# Patient Record
Sex: Male | Born: 1965
Health system: Southern US, Community
[De-identification: ages and names within clinical notes are randomized; demographics above are authoritative.]

## PROBLEM LIST (undated history)

## (undated) DIAGNOSIS — E079 Disorder of thyroid, unspecified: Secondary | ICD-10-CM

## (undated) DIAGNOSIS — M1990 Unspecified osteoarthritis, unspecified site: Secondary | ICD-10-CM

## (undated) DIAGNOSIS — I10 Essential (primary) hypertension: Secondary | ICD-10-CM

## (undated) DIAGNOSIS — E785 Hyperlipidemia, unspecified: Secondary | ICD-10-CM

## (undated) DIAGNOSIS — K219 Gastro-esophageal reflux disease without esophagitis: Secondary | ICD-10-CM

## (undated) HISTORY — PX: WISDOM TOOTH EXTRACTION: SHX21

## (undated) HISTORY — PX: UPPER GASTROINTESTINAL ENDOSCOPY: SHX188

## (undated) HISTORY — PX: APPENDECTOMY: SHX54

## (undated) HISTORY — PX: OTHER SURGICAL HISTORY: SHX169

## (undated) HISTORY — DX: Gastro-esophageal reflux disease without esophagitis: K21.9

## (undated) HISTORY — DX: Unspecified osteoarthritis, unspecified site: M19.90

## (undated) HISTORY — DX: Essential (primary) hypertension: I10

## (undated) HISTORY — DX: Hyperlipidemia, unspecified: E78.5

## (undated) HISTORY — DX: Disorder of thyroid, unspecified: E07.9

---

## 2006-01-17 ENCOUNTER — Ambulatory Visit: Payer: Self-pay | Admitting: Family Medicine

## 2006-04-19 ENCOUNTER — Ambulatory Visit: Payer: Self-pay | Admitting: Family Medicine

## 2006-05-09 ENCOUNTER — Ambulatory Visit: Payer: Self-pay | Admitting: Family Medicine

## 2009-02-17 ENCOUNTER — Ambulatory Visit: Payer: Self-pay | Admitting: Family Medicine

## 2010-01-02 HISTORY — PX: ESOPHAGOGASTRODUODENOSCOPY: SHX1529

## 2010-02-02 ENCOUNTER — Ambulatory Visit
Admission: RE | Admit: 2010-02-02 | Discharge: 2010-02-02 | Disposition: A | Discharge status: Home / Self Care | Payer: BC Managed Care – HMO | Source: Ambulatory Visit | Attending: Chiropractic Medicine | Admitting: Chiropractic Medicine

## 2010-02-02 ENCOUNTER — Other Ambulatory Visit: Payer: Self-pay | Admitting: Chiropractic Medicine

## 2010-02-02 DIAGNOSIS — M79642 Pain in left hand: Secondary | ICD-10-CM

## 2010-02-28 ENCOUNTER — Ambulatory Visit (INDEPENDENT_AMBULATORY_CARE_PROVIDER_SITE_OTHER): Payer: BC Managed Care – HMO | Admitting: Family Medicine

## 2010-02-28 DIAGNOSIS — J209 Acute bronchitis, unspecified: Secondary | ICD-10-CM

## 2010-02-28 DIAGNOSIS — K219 Gastro-esophageal reflux disease without esophagitis: Secondary | ICD-10-CM

## 2010-02-28 DIAGNOSIS — J0100 Acute maxillary sinusitis, unspecified: Secondary | ICD-10-CM

## 2010-02-28 DIAGNOSIS — R1310 Dysphagia, unspecified: Secondary | ICD-10-CM

## 2010-03-04 ENCOUNTER — Institutional Professional Consult (permissible substitution) (INDEPENDENT_AMBULATORY_CARE_PROVIDER_SITE_OTHER): Payer: BC Managed Care – PPO | Admitting: Family Medicine

## 2010-03-04 DIAGNOSIS — R030 Elevated blood-pressure reading, without diagnosis of hypertension: Secondary | ICD-10-CM

## 2010-03-04 DIAGNOSIS — R946 Abnormal results of thyroid function studies: Secondary | ICD-10-CM

## 2010-03-04 DIAGNOSIS — E739 Lactose intolerance, unspecified: Secondary | ICD-10-CM

## 2010-04-25 ENCOUNTER — Ambulatory Visit (INDEPENDENT_AMBULATORY_CARE_PROVIDER_SITE_OTHER): Payer: BC Managed Care – PPO | Admitting: Family Medicine

## 2010-04-25 DIAGNOSIS — F430 Acute stress reaction: Secondary | ICD-10-CM

## 2010-04-25 DIAGNOSIS — I10 Essential (primary) hypertension: Secondary | ICD-10-CM

## 2010-04-25 DIAGNOSIS — E039 Hypothyroidism, unspecified: Secondary | ICD-10-CM

## 2010-06-13 ENCOUNTER — Ambulatory Visit: Payer: BC Managed Care – PPO | Admitting: Family Medicine

## 2010-08-25 ENCOUNTER — Encounter: Payer: Self-pay | Admitting: Family Medicine

## 2010-08-25 ENCOUNTER — Other Ambulatory Visit: Payer: Self-pay | Admitting: Family Medicine

## 2010-08-25 ENCOUNTER — Ambulatory Visit (INDEPENDENT_AMBULATORY_CARE_PROVIDER_SITE_OTHER): Payer: BC Managed Care – PPO | Admitting: Family Medicine

## 2010-08-25 VITALS — BP 150/110 | HR 76 | Wt 240.0 lb

## 2010-08-25 DIAGNOSIS — K219 Gastro-esophageal reflux disease without esophagitis: Secondary | ICD-10-CM

## 2010-08-25 DIAGNOSIS — I10 Essential (primary) hypertension: Secondary | ICD-10-CM

## 2010-08-25 DIAGNOSIS — Z79899 Other long term (current) drug therapy: Secondary | ICD-10-CM

## 2010-08-25 DIAGNOSIS — Z23 Encounter for immunization: Secondary | ICD-10-CM

## 2010-08-25 DIAGNOSIS — E039 Hypothyroidism, unspecified: Secondary | ICD-10-CM

## 2010-08-25 MED ORDER — LISINOPRIL-HYDROCHLOROTHIAZIDE 10-12.5 MG PO TABS: 1.0000 | ORAL_TABLET | Freq: Every day | ORAL | Status: DC

## 2010-08-25 MED ORDER — LEVOTHYROXINE SODIUM 100 MCG PO TABS: 100.0000 ug | ORAL_TABLET | Freq: Every day | ORAL | Status: DC

## 2010-08-25 NOTE — Patient Instructions (Signed)
Take her thyroid medicine and blood pressure medicine regularly. Return here one month for blood pressure recheck. Strongly consider getting counseling concerning your marriage.

## 2010-08-25 NOTE — Progress Notes (Signed)
  Subjective:    Patient ID: Justin Green, male    DOB: 04-06-1965, 45 y.o.   MRN: [id number]  HPI He is here for recheck. He ran out of his blood pressure medications however he has continued on his thyroid medicine as well as reflux medication. The reflux medicine seems to be doing well. He also noted that when he got back on his thyroid medicine, he did have an energy increase. He and his wife are now separated. He seems to be handling this well but is somewhat resigned to it. They did not get counseling. They are still in contact with each other.   Review of Systems     Objective:   Physical Exam Alert and in no distress. Blood pressure is recorded.       Assessment & Plan:  Hypertension. Hypothyroid. GERD. Situational stress I will give him a flu shot. His blood pressure medicine was renewed. TSH will be drawn. Continue on his current medication. I again stressed the need for him to get involved in counseling just to make sure he learns all the right things from his marriage. Approximately 25 minutes spent discussing these issues with him.

## 2010-08-26 ENCOUNTER — Telehealth: Payer: Self-pay

## 2010-08-26 NOTE — Telephone Encounter (Signed)
Called pt to inform of labs left message ths normal continue on present meds

## 2011-01-17 ENCOUNTER — Ambulatory Visit (INDEPENDENT_AMBULATORY_CARE_PROVIDER_SITE_OTHER): Payer: BC Managed Care – PPO | Admitting: Family Medicine

## 2011-01-17 ENCOUNTER — Encounter: Payer: Self-pay | Admitting: Family Medicine

## 2011-01-17 VITALS — BP 140/100 | HR 72 | Wt 250.0 lb

## 2011-01-17 DIAGNOSIS — R5383 Other fatigue: Secondary | ICD-10-CM

## 2011-01-17 DIAGNOSIS — I10 Essential (primary) hypertension: Secondary | ICD-10-CM

## 2011-01-17 DIAGNOSIS — R5381 Other malaise: Secondary | ICD-10-CM

## 2011-01-17 DIAGNOSIS — K219 Gastro-esophageal reflux disease without esophagitis: Secondary | ICD-10-CM | POA: Insufficient documentation

## 2011-01-17 MED ORDER — LISINOPRIL-HYDROCHLOROTHIAZIDE 20-12.5 MG PO TABS: 1.0000 | ORAL_TABLET | Freq: Every day | ORAL | Status: DC

## 2011-01-17 NOTE — Progress Notes (Signed)
  Subjective:    Patient ID: Justin Green, male    DOB: 01/31/1965, 46 y.o.   MRN: [id number]  HPI He is here for consult. He did injure his left lateral rib area over the holidays. He was evaluated and given tramadol. The pain is slowly going away. He also recently injured his left shoulder when he had it forcibly pushed posteriorly. He is now having some posterior shoulder discomfort. He continues on his blood pressure medication. She is concerned over possibility of sleep apnea and does note snoring as well as his ex-wife saying he does stop breathing. He has also had difficulty with falling asleep easily while sitting in a chair but no trouble with driving. He does have reflux disease and presently is on Dexilant and getting good results.  Review of Systems     Objective:   Physical Exam No tenderness to palpation of his ribs. Lungs are clear to auscultation. Shoulder exam shows limitation of external rotation or abduction is fairly good. No laxity noted. No tenderness to palpation.      Assessment & Plan:   1. Fatigue   2. Hypertension   3. GERD (gastroesophageal reflux disease)   4 rib pain. 5 shoulder strain. Continue with conservative care for the rib and the shoulder. I will order a sleep study. Increase his lisinopril and recheck this in one or 2 months. Continue on his Exelon.

## 2011-02-01 ENCOUNTER — Telehealth: Payer: Self-pay

## 2011-02-01 NOTE — Telephone Encounter (Signed)
LEFT MESSAGE FOR PT TO CALL ME BACK PT NEEDS OFFICEL SLEEP STUDY DOES HE WANT ONE AT HOME OR SOMEWHERE OUT

## 2011-02-22 ENCOUNTER — Other Ambulatory Visit: Payer: Self-pay

## 2011-02-22 ENCOUNTER — Telehealth: Payer: Self-pay

## 2011-02-22 DIAGNOSIS — G4730 Sleep apnea, unspecified: Secondary | ICD-10-CM

## 2011-02-22 NOTE — Telephone Encounter (Signed)
Called 3rd time ans left message pt needs offical sleep study can be done at home or out just to let me know I will send everything in and they will contact him

## 2011-03-09 ENCOUNTER — Other Ambulatory Visit: Payer: Self-pay | Admitting: Family Medicine

## 2011-03-15 ENCOUNTER — Ambulatory Visit (HOSPITAL_BASED_OUTPATIENT_CLINIC_OR_DEPARTMENT_OTHER): Payer: BC Managed Care – PPO | Attending: Family Medicine | Admitting: Radiology

## 2011-03-15 VITALS — Ht 70.0 in | Wt 240.0 lb

## 2011-03-15 DIAGNOSIS — G4733 Obstructive sleep apnea (adult) (pediatric): Secondary | ICD-10-CM | POA: Insufficient documentation

## 2011-03-15 DIAGNOSIS — G4730 Sleep apnea, unspecified: Secondary | ICD-10-CM

## 2011-03-20 ENCOUNTER — Ambulatory Visit: Payer: BC Managed Care – PPO | Admitting: Family Medicine

## 2011-03-25 DIAGNOSIS — G4733 Obstructive sleep apnea (adult) (pediatric): Secondary | ICD-10-CM

## 2011-03-25 NOTE — Procedures (Signed)
NAME:  Justin Green, Justin Green NO.:  [id number]  MEDICAL RECORD NO.:  [id number]          PATIENT TYPE:  OUT  LOCATION:  SLEEP CENTER                 FACILITY:  [facility]  PHYSICIAN:  Barbaraann Share, MD,FCCPDATE OF BIRTH:  Sep 10, 1965  DATE OF STUDY:  03/15/2011                           NOCTURNAL POLYSOMNOGRAM  REFERRING PHYSICIAN:  Sharlot Gowda, M.D.  INDICATION FOR STUDY:  Hypersomnia with sleep apnea.  EPWORTH SLEEPINESS SCORE:  5.  MEDICATIONS:  SLEEP ARCHITECTURE:  The patient had a total sleep time of 344 minutes with no slow-wave sleep and only 48 minutes of REM.  Sleep onset latency was normal at 11 minutes and REM onset was normal at 89 minutes.  Sleep efficiency was moderately reduced at 72%.  RESPIRATORY DATA:  The patient was found to have 3 apneas and 74 obstructive hypopneas, giving him an apnea-hypopnea index of 13 events per hour.  The events occurred in all body positions and there was loud snoring noted throughout.  OXYGEN DATA:  There was O2 desaturation as low as 84% with the patient's obstructive events.  CARDIAC DATA:  No clinically significant arrhythmias were noted.  MOVEMENT-PARASOMNIA:  The patient had no significant leg jerks or other abnormal behavior seen.  IMPRESSIONS-RECOMMENDATIONS:  Mild obstructive sleep apnea/hypopnea syndrome with an AHI of 13 events per hour, and O2 desaturation as low as 84%.  Treatment for this degree of sleep apnea can include a trial of weight loss alone, upper airway surgery, dental appliance, and also CPAP.  It should be noted the patient did not meet split night criteria secondary to the majority of his events occurring after 1 a.m.     Barbaraann Share, MD,FCCP Diplomate, American Board of Sleep Medicine    [facility]MED[facility]  D:  03/25/2011 18:47:03  T:  03/25/2011 19:12:24  Job:  [id number]

## 2011-03-27 ENCOUNTER — Ambulatory Visit: Payer: BC Managed Care – PPO | Admitting: Family Medicine

## 2011-04-03 ENCOUNTER — Encounter: Payer: Self-pay | Admitting: Family Medicine

## 2011-04-03 ENCOUNTER — Ambulatory Visit (INDEPENDENT_AMBULATORY_CARE_PROVIDER_SITE_OTHER): Payer: BC Managed Care – PPO | Admitting: Family Medicine

## 2011-04-03 VITALS — BP 120/80 | HR 83 | Wt 250.0 lb

## 2011-04-03 DIAGNOSIS — M25519 Pain in unspecified shoulder: Secondary | ICD-10-CM

## 2011-04-03 DIAGNOSIS — I10 Essential (primary) hypertension: Secondary | ICD-10-CM

## 2011-04-03 DIAGNOSIS — M25512 Pain in left shoulder: Secondary | ICD-10-CM

## 2011-04-03 DIAGNOSIS — G4730 Sleep apnea, unspecified: Secondary | ICD-10-CM

## 2011-04-03 NOTE — Patient Instructions (Signed)
Let me know how you're shoulder feels. Return here in 2 months for recheck on your weight and to further discuss sleep apnea

## 2011-04-03 NOTE — Progress Notes (Signed)
  Subjective:    Patient ID: Justin Green, male    DOB: [birth year], 46 y.o.   MRN: [id number]  HPI He is here for followup visit. He was recently diagnosed with sleep apnea. That report was reviewed with him. Strongly encouraged weight reduction among other issues. He continues on his blood pressure medication and is having no difficulty with this. He continues to have difficulty with left shoulder pain. He describes an injury several months ago of internal rotation of the left shoulder adducted and then direct, to the posterior aspect of the shoulder. Since then he has had difficulty with abduction and external rotation and has noted decreased range of motion. No popping or locking.   Review of Systems     Objective:   Physical Exam Alert and in no distress. No point tenderness to palpation. Abduction and external rotation cause pain and was limited compared to the other side. Doppler test was uncomfortable. Supraspinatus testing was painful.       Assessment & Plan:   1. Hypertension   2. Sleep apnea   3. Left shoulder pain    continue blood pressure medication. Discussed sleep apnea with him in detail. He will make an effort to change his diet and exercise. Recheck this in 2 months. Discussed options concerning the shoulder. Decided to inject this with Xylocaine and epinephrine. 40 mg of Kenalog and 3 cc of Xylocaine was injected into the subacromial bursa without difficulty. He tolerated the procedure well. Hopefully this will improve his symptoms and if not further physical therapy and/or MRI will be done based on his response.

## 2011-05-22 ENCOUNTER — Encounter: Payer: Self-pay | Admitting: Family Medicine

## 2011-05-22 ENCOUNTER — Ambulatory Visit (INDEPENDENT_AMBULATORY_CARE_PROVIDER_SITE_OTHER): Payer: BC Managed Care – PPO | Admitting: Family Medicine

## 2011-05-22 VITALS — BP 122/82 | HR 70 | Wt 244.0 lb

## 2011-05-22 DIAGNOSIS — M25512 Pain in left shoulder: Secondary | ICD-10-CM

## 2011-05-22 DIAGNOSIS — K219 Gastro-esophageal reflux disease without esophagitis: Secondary | ICD-10-CM

## 2011-05-22 DIAGNOSIS — M25519 Pain in unspecified shoulder: Secondary | ICD-10-CM

## 2011-05-22 DIAGNOSIS — I10 Essential (primary) hypertension: Secondary | ICD-10-CM

## 2011-05-22 DIAGNOSIS — Z23 Encounter for immunization: Secondary | ICD-10-CM

## 2011-05-22 NOTE — Patient Instructions (Signed)
Use the Prilosec as needed. Stretch it out as much as you can

## 2011-05-22 NOTE — Progress Notes (Signed)
  Subjective:    Patient ID: Justin Green, male    DOB: January 08, 1965, 46 y.o.   MRN: [id number]  HPI He is here for blood pressure recheck. He continues on medications listed in the chart and is having no difficulty with it. His reflux is under good control. The left shoulder is doing better but he still occasionally will have a slight twinge. He was able to play golf last weekend it had no difficulty with that.   Review of Systems     Objective:   Physical Exam Alert and in no distress otherwise not examined       Assessment & Plan:   1. Hypertension   2. GERD (gastroesophageal reflux disease)   3. Left shoulder pain    continue present medication regimen. Discussed sending him for shoulder rehabilitation however his scheduled be quite busy. He will check with the physical therapist with the [facility] when he goes out there in the next several weeks.

## 2011-08-21 ENCOUNTER — Ambulatory Visit (INDEPENDENT_AMBULATORY_CARE_PROVIDER_SITE_OTHER): Payer: BC Managed Care – PPO | Admitting: Family Medicine

## 2011-08-21 ENCOUNTER — Encounter: Payer: Self-pay | Admitting: Family Medicine

## 2011-08-21 VITALS — BP 150/100 | HR 81 | Wt 255.0 lb

## 2011-08-21 DIAGNOSIS — M7700 Medial epicondylitis, unspecified elbow: Secondary | ICD-10-CM

## 2011-08-21 DIAGNOSIS — M7701 Medial epicondylitis, right elbow: Secondary | ICD-10-CM

## 2011-08-21 NOTE — Progress Notes (Signed)
  Subjective:    Patient ID: Justin Green, male    DOB: August 24, 1965, 46 y.o.   MRN: [id number]  HPI He has been having right medial elbow pain intermittently for the last year or 2. This bothers him especially when he plays golf. 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   Review of Systems     Objective:   Physical Exam Full motion of the elbow. Tender to palpation over the medial epicondyle. No tenderness over the ulnar notch.       Assessment & Plan:   1. Medial epicondylitis of right elbow    recommend heat, anti-inflammatory, elbow padding. Discussed physical therapy and possibly injecting this but not at this time.

## 2011-08-21 NOTE — Patient Instructions (Signed)
Heat 20 minutes 3 times a day. Use the elbow pad keep it about an inch below the medial epicondyle. Potentially consider getting larger grips for your golf clubs.

## 2011-11-05 IMAGING — CR DG HAND COMPLETE 3+V*L*
4 series · 4 of 4 positions shown · non-contrast
Comparison: None.

CLINICAL DATA: Middle finger injury with pain and PIP joint
distally.

LEFT HAND - COMPLETE 3+ VIEW

[x hand pa left]
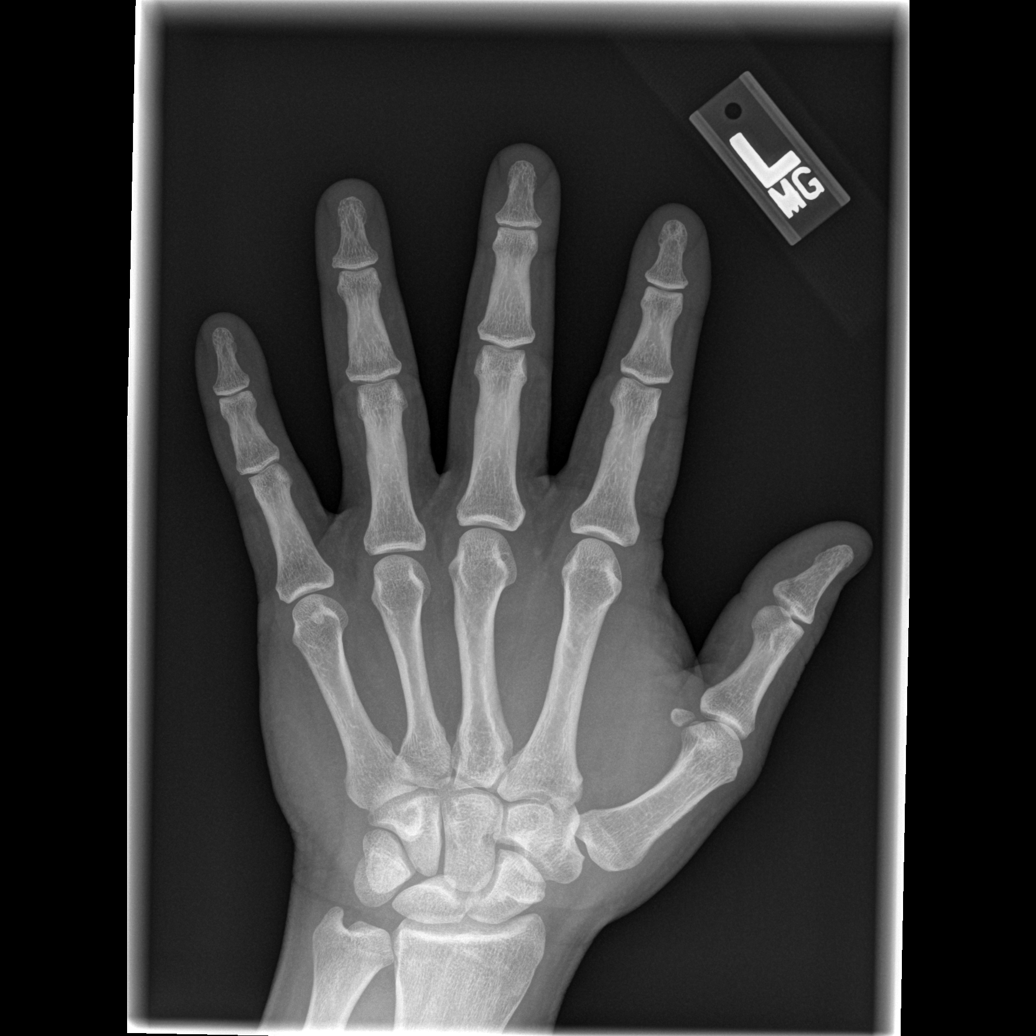

[x hand oblique left]
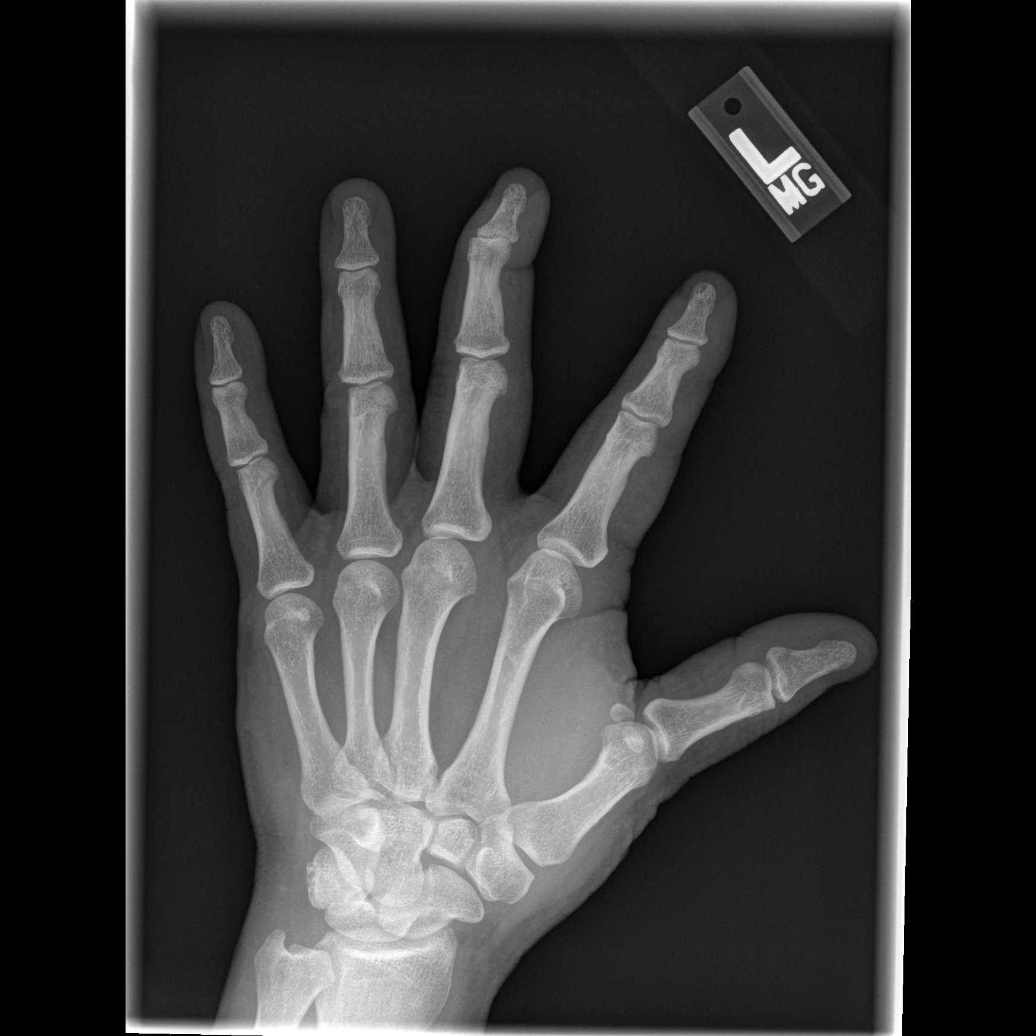

[x hand lat left (1 of 2)]
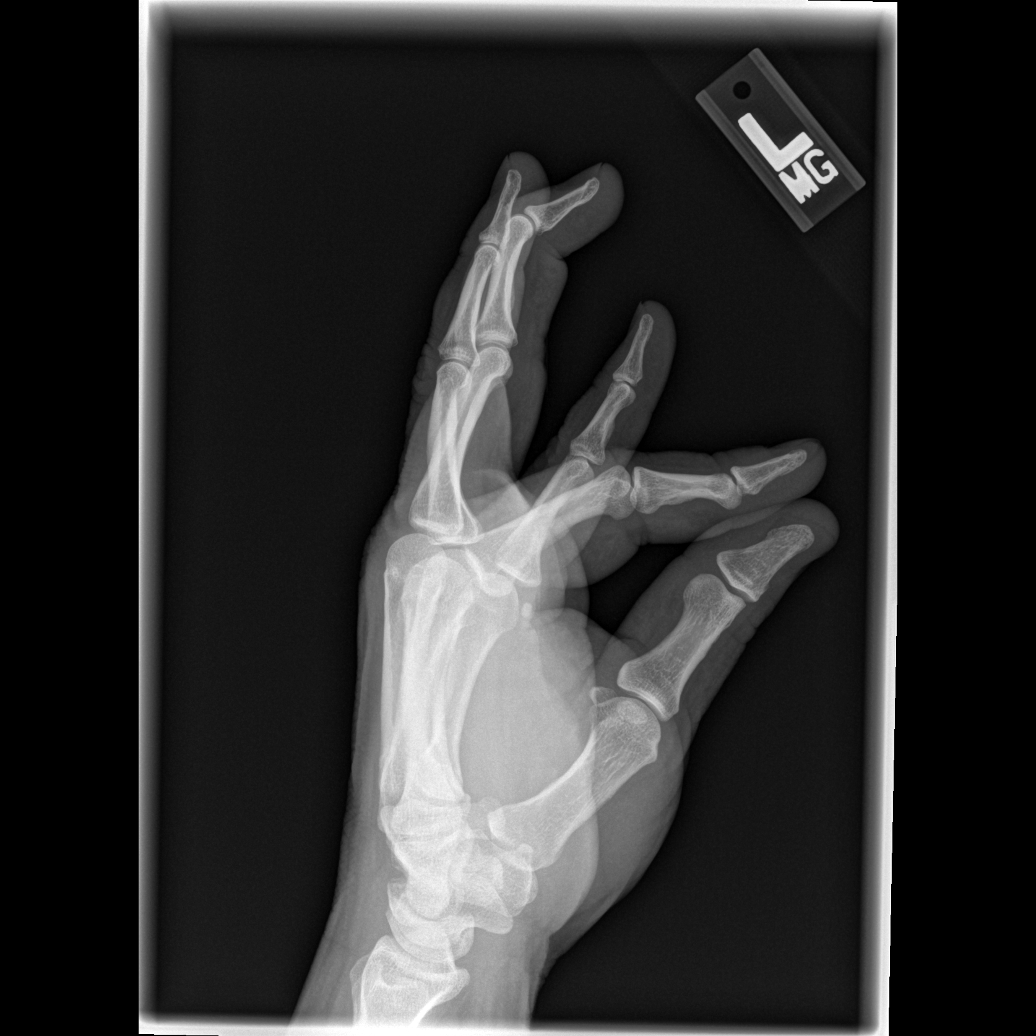

[x hand lat left (2 of 2)]
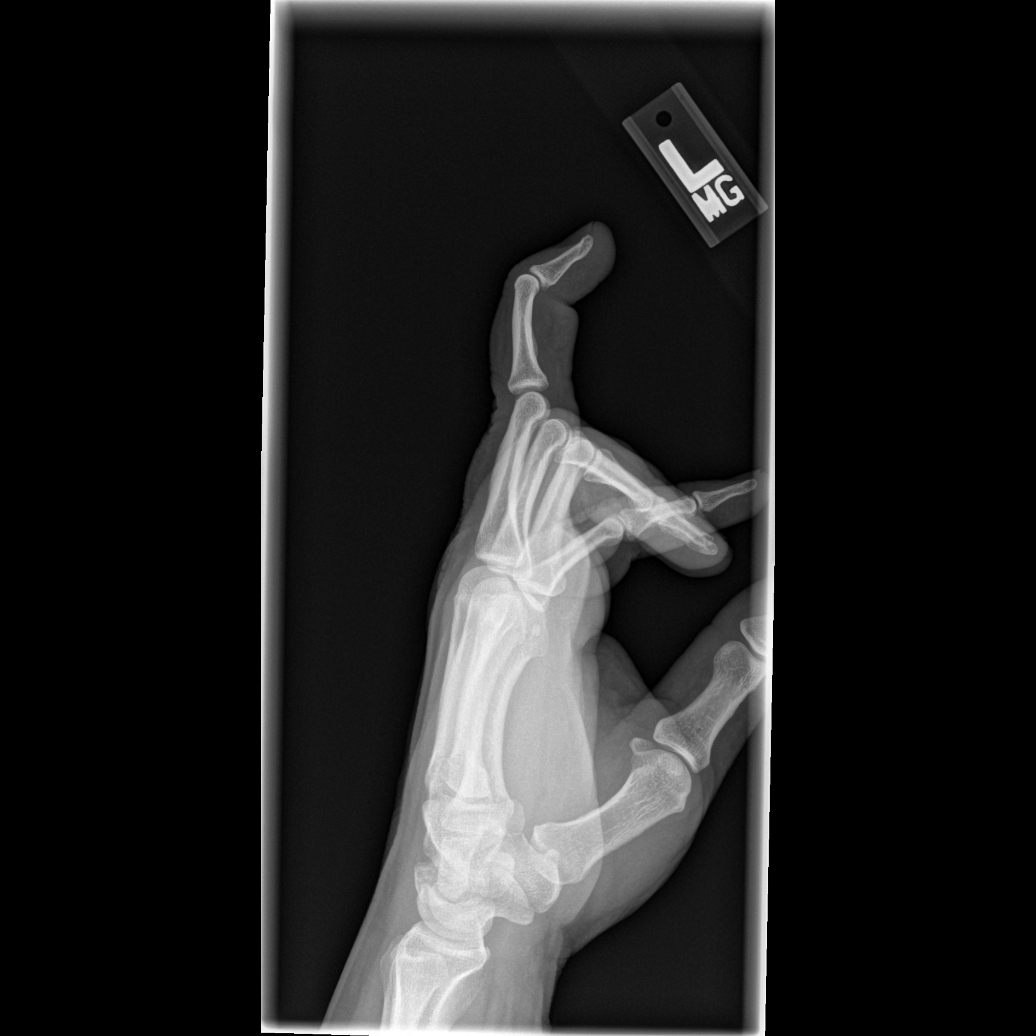

[4 of 4 positions shown; findings below may reference images not displayed]

FINDINGS: Three-view exam shows no evidence for an acute fracture.
No subluxation or dislocation.  There is flexion deformity at the
DIP joint of middle finger without evidence for bony avulsion from
the base of the distal phalanx.
IMPRESSION: Flexion deformity at the DIP joint of the middle finger without
associated acute bony findings.

## 2012-01-15 ENCOUNTER — Other Ambulatory Visit: Payer: Self-pay | Admitting: Family Medicine

## 2012-03-21 ENCOUNTER — Other Ambulatory Visit: Payer: Self-pay | Admitting: Family Medicine

## 2012-04-19 ENCOUNTER — Other Ambulatory Visit: Payer: Self-pay | Admitting: Family Medicine

## 2012-06-11 ENCOUNTER — Ambulatory Visit (INDEPENDENT_AMBULATORY_CARE_PROVIDER_SITE_OTHER): Payer: BC Managed Care – PPO | Admitting: Family Medicine

## 2012-06-11 ENCOUNTER — Encounter: Payer: Self-pay | Admitting: Family Medicine

## 2012-06-11 VITALS — BP 130/88 | HR 68 | Ht 69.0 in | Wt 250.0 lb

## 2012-06-11 DIAGNOSIS — G4730 Sleep apnea, unspecified: Secondary | ICD-10-CM

## 2012-06-11 DIAGNOSIS — Z79899 Other long term (current) drug therapy: Secondary | ICD-10-CM

## 2012-06-11 DIAGNOSIS — Z0000 Encounter for general adult medical examination without abnormal findings: Secondary | ICD-10-CM

## 2012-06-11 DIAGNOSIS — E669 Obesity, unspecified: Secondary | ICD-10-CM

## 2012-06-11 DIAGNOSIS — K219 Gastro-esophageal reflux disease without esophagitis: Secondary | ICD-10-CM

## 2012-06-11 DIAGNOSIS — E039 Hypothyroidism, unspecified: Secondary | ICD-10-CM | POA: Insufficient documentation

## 2012-06-11 DIAGNOSIS — I10 Essential (primary) hypertension: Secondary | ICD-10-CM

## 2012-06-11 LAB — CBC WITH DIFFERENTIAL/PLATELET
Basophils Absolute: 0 10*3/uL (ref 0.0–0.1)
HCT: 42.5 % (ref 39.0–52.0)
Lymphocytes Relative: 22 % (ref 12–46)
Monocytes Absolute: 0.8 10*3/uL (ref 0.1–1.0)
Neutro Abs: 5.7 10*3/uL (ref 1.7–7.7)
RDW: 13.2 % (ref 11.5–15.5)
WBC: 8.6 10*3/uL (ref 4.0–10.5)

## 2012-06-11 LAB — POCT URINALYSIS DIPSTICK
Bilirubin, UA: NEGATIVE
Ketones, UA: NEGATIVE
Leukocytes, UA: NEGATIVE
Protein, UA: NEGATIVE

## 2012-06-11 LAB — COMPREHENSIVE METABOLIC PANEL
ALT: 22 U/L (ref 0–53)
AST: 15 U/L (ref 0–37)
Albumin: 4.4 g/dL (ref 3.5–5.2)
BUN: 22 mg/dL (ref 6–23)
Calcium: 9.3 mg/dL (ref 8.4–10.5)
Chloride: 100 mEq/L (ref 96–112)
Potassium: 4.2 mEq/L (ref 3.5–5.3)
Sodium: 138 mEq/L (ref 135–145)
Total Protein: 6.9 g/dL (ref 6.0–8.3)

## 2012-06-11 LAB — HEMOCCULT GUIAC POC 1CARD (OFFICE)

## 2012-06-11 LAB — TSH: TSH: 7.469 u[IU]/mL — ABNORMAL HIGH (ref 0.350–4.500)

## 2012-06-11 MED ORDER — LISINOPRIL-HYDROCHLOROTHIAZIDE 20-12.5 MG PO TABS: ORAL_TABLET | ORAL | Status: DC

## 2012-06-11 MED ORDER — LEVOTHYROXINE SODIUM 100 MCG PO TABS: ORAL_TABLET | ORAL | Status: DC

## 2012-06-11 NOTE — Progress Notes (Signed)
  Subjective:    Patient ID: Justin Green, male    DOB: 12-08-1965, 47 y.o.   MRN: [id number]  HPI He is here for complete examination. He does have underlying hypertension and continues on his lisinopril without difficulty. He also takes his reflux medication every other day and this controls his symptoms. He does have underlying sleep apnea but is not on CPAP. He continues on his thyroid medication. Social and family history were reviewed. Patient is dating someone but not using condoms. His immunizations were reviewed.   Review of Systems Negative except as above    Objective:   Physical Exam BP 130/88  Pulse 68  Ht 5\' 9"  (1.753 m)  Wt 250 lb (113.399 kg)  BMI 36.9 kg/m2  General Appearance:    Alert, cooperative, no distress, appears stated age  Head:    Normocephalic, without obvious abnormality, atraumatic  Eyes:    PERRL, conjunctiva/corneas clear, EOM's intact, fundi    benign  Ears:    Normal TM's and external ear canals  Nose:   Nares normal, mucosa normal, no drainage or sinus   tenderness  Throat:   Lips, mucosa, and tongue normal; teeth and gums normal  Neck:   Supple, no lymphadenopathy;  thyroid:  no   enlargement/tenderness/nodules; no carotid   bruit or JVD  Back:    Spine nontender, no curvature, ROM normal, no CVA     tenderness  Lungs:     Clear to auscultation bilaterally without wheezes, rales or     ronchi; respirations unlabored  Chest Wall:    No tenderness or deformity   Heart:    Regular rate and rhythm, S1 and S2 normal, no murmur, rub   or gallop  Breast Exam:    No chest wall tenderness, masses or gynecomastia  Abdomen:     Soft, non-tender, nondistended, normoactive bowel sounds,    no masses, no hepatosplenomegaly  Genitalia:    Normal male external genitalia without lesions.  Testicles without masses.  No inguinal hernias.  Rectal:    Normal sphincter tone, no masses or tenderness; guaiac negative stool.  Prostate smooth, no nodules, not  enlarged.  Extremities:   No clubbing, cyanosis or edema  Pulses:   2+ and symmetric all extremities  Skin:   Skin color, texture, turgor normal, no rashes or lesions  Lymph nodes:   Cervical, supraclavicular, and axillary nodes normal  Neurologic:   CNII-XII intact, normal strength, sensation and gait; reflexes 2+ and symmetric throughout          Psych:   Normal mood, affect, hygiene and grooming.          Assessment & Plan:  Routine general medical examination at a health care facility - Plan: CBC with Differential, Comprehensive metabolic panel, Lipid panel, Hemoccult - 1 Card (office)  Hypertension - Plan: POCT urinalysis dipstick, lisinopril-hydrochlorothiazide (PRINZIDE,ZESTORETIC) 20-12.5 MG per tablet  GERD (gastroesophageal reflux disease)  Sleep apnea  Hypothyroid - Plan: levothyroxine (SYNTHROID, LEVOTHROID) 100 MCG tablet, TSH  Encounter for long-term (current) use of other medications - Plan: CBC with Differential, Comprehensive metabolic panel, Lipid panel, TSH  Obesity (BMI 30-39.9) his diet and exercise pattern due to work are going to be difficult for him to change. Strongly encouraged him to use condoms with his new girlfriend. continue on his present medication regimen.

## 2012-06-12 NOTE — Progress Notes (Signed)
Quick Note:  CALLED PT CELL # LEFT WORD FOR WORD MESSAGE The thyroid function test is indicating low thyroid. Reinforced the need for him to take it in the morning on an empty stomach and let's recheck this in several months ______

## 2012-08-20 ENCOUNTER — Other Ambulatory Visit: Payer: Self-pay | Admitting: Family Medicine

## 2012-12-25 ENCOUNTER — Encounter: Payer: Self-pay | Admitting: Family Medicine

## 2013-06-30 ENCOUNTER — Other Ambulatory Visit: Payer: Self-pay | Admitting: Family Medicine

## 2013-08-30 ENCOUNTER — Other Ambulatory Visit: Payer: Self-pay | Admitting: Family Medicine

## 2013-09-01 ENCOUNTER — Other Ambulatory Visit: Payer: Self-pay | Admitting: Family Medicine

## 2013-09-24 ENCOUNTER — Other Ambulatory Visit: Payer: Self-pay

## 2013-10-17 ENCOUNTER — Other Ambulatory Visit: Payer: Self-pay

## 2014-08-07 ENCOUNTER — Other Ambulatory Visit: Payer: Self-pay | Admitting: Family Medicine

## 2014-08-10 ENCOUNTER — Other Ambulatory Visit: Payer: Self-pay | Admitting: Family Medicine

## 2014-08-10 ENCOUNTER — Telehealth: Payer: Self-pay | Admitting: Family Medicine

## 2014-08-10 MED ORDER — LEVOTHYROXINE SODIUM 100 MCG PO TABS: 100.0000 ug | ORAL_TABLET | Freq: Every day | ORAL | Status: DC

## 2014-08-10 NOTE — Telephone Encounter (Signed)
He needs a visit. Don't let him run out

## 2014-08-10 NOTE — Telephone Encounter (Signed)
Dr.Lalonde pt has not been here since 06/2012

## 2014-08-10 NOTE — Telephone Encounter (Signed)
done

## 2014-08-10 NOTE — Telephone Encounter (Signed)
Rcvd refill request for Levothyroxine 166mcg

## 2014-08-10 NOTE — Telephone Encounter (Signed)
He needs an office visit. Give him enough to cover until he comes in

## 2014-08-28 ENCOUNTER — Encounter: Payer: Self-pay | Admitting: Family Medicine

## 2014-08-28 ENCOUNTER — Ambulatory Visit (INDEPENDENT_AMBULATORY_CARE_PROVIDER_SITE_OTHER): Payer: BLUE CROSS/BLUE SHIELD | Admitting: Family Medicine

## 2014-08-28 ENCOUNTER — Other Ambulatory Visit: Payer: Self-pay | Admitting: Family Medicine

## 2014-08-28 VITALS — BP 120/90 | HR 76 | Ht 69.0 in | Wt 262.8 lb

## 2014-08-28 DIAGNOSIS — E038 Other specified hypothyroidism: Secondary | ICD-10-CM

## 2014-08-28 DIAGNOSIS — I10 Essential (primary) hypertension: Secondary | ICD-10-CM

## 2014-08-28 DIAGNOSIS — K219 Gastro-esophageal reflux disease without esophagitis: Secondary | ICD-10-CM

## 2014-08-28 DIAGNOSIS — Z0000 Encounter for general adult medical examination without abnormal findings: Secondary | ICD-10-CM | POA: Diagnosis not present

## 2014-08-28 DIAGNOSIS — Z23 Encounter for immunization: Secondary | ICD-10-CM | POA: Diagnosis not present

## 2014-08-28 DIAGNOSIS — G4730 Sleep apnea, unspecified: Secondary | ICD-10-CM | POA: Diagnosis not present

## 2014-08-28 DIAGNOSIS — R7303 Prediabetes: Secondary | ICD-10-CM

## 2014-08-28 LAB — CBC WITH DIFFERENTIAL/PLATELET
BASOS PCT: 1 % (ref 0–1)
Basophils Absolute: 0.1 10*3/uL (ref 0.0–0.1)
EOS PCT: 2 % (ref 0–5)
Eosinophils Absolute: 0.2 10*3/uL (ref 0.0–0.7)
HEMATOCRIT: 44.4 % (ref 39.0–52.0)
Hemoglobin: 14.9 g/dL (ref 13.0–17.0)
Lymphocytes Relative: 23 % (ref 12–46)
Lymphs Abs: 1.9 10*3/uL (ref 0.7–4.0)
MCH: 29.3 pg (ref 26.0–34.0)
MCHC: 33.6 g/dL (ref 30.0–36.0)
MCV: 87.2 fL (ref 78.0–100.0)
MONO ABS: 0.8 10*3/uL (ref 0.1–1.0)
MONOS PCT: 9 % (ref 3–12)
MPV: 11.3 fL (ref 8.6–12.4)
NEUTROS ABS: 5.5 10*3/uL (ref 1.7–7.7)
Neutrophils Relative %: 65 % (ref 43–77)
Platelets: 210 10*3/uL (ref 150–400)
RBC: 5.09 MIL/uL (ref 4.22–5.81)
RDW: 13.4 % (ref 11.5–15.5)
WBC: 8.4 10*3/uL (ref 4.0–10.5)

## 2014-08-28 LAB — POCT URINALYSIS DIPSTICK
Bilirubin, UA: NEGATIVE
Blood, UA: NEGATIVE
Glucose, UA: NEGATIVE
Ketones, UA: NEGATIVE
Leukocytes, UA: NEGATIVE
NITRITE UA: NEGATIVE
PH UA: 5.5
PROTEIN UA: NEGATIVE
Spec Grav, UA: >=1.03
UROBILINOGEN UA: NEGATIVE

## 2014-08-28 LAB — LIPID PANEL
CHOLESTEROL: 171 mg/dL (ref 125–200)
HDL: 37 mg/dL — ABNORMAL LOW (ref >=40)
LDL CALC: 110 mg/dL (ref <130)
Total CHOL/HDL Ratio: 4.6 Ratio (ref <=5.0)
Triglycerides: 121 mg/dL (ref <150)
VLDL: 24 mg/dL (ref <30)

## 2014-08-28 LAB — COMPREHENSIVE METABOLIC PANEL
ALK PHOS: 65 U/L (ref 40–115)
ALT: 25 U/L (ref 9–46)
AST: 19 U/L (ref 10–40)
Albumin: 4.1 g/dL (ref 3.6–5.1)
BUN: 23 mg/dL (ref 7–25)
CO2: 25 mmol/L (ref 20–31)
CREATININE: 1.22 mg/dL (ref 0.60–1.35)
Calcium: 9.2 mg/dL (ref 8.6–10.3)
Chloride: 102 mmol/L (ref 98–110)
Glucose, Bld: 117 mg/dL — ABNORMAL HIGH (ref 65–99)
POTASSIUM: 4.3 mmol/L (ref 3.5–5.3)
SODIUM: 138 mmol/L (ref 135–146)
Total Bilirubin: 0.4 mg/dL (ref 0.2–1.2)
Total Protein: 7 g/dL (ref 6.1–8.1)

## 2014-08-28 LAB — TSH: TSH: 4.643 u[IU]/mL — ABNORMAL HIGH (ref 0.350–4.500)

## 2014-08-28 MED ORDER — LISINOPRIL-HYDROCHLOROTHIAZIDE 20-12.5 MG PO TABS: 1.0000 | ORAL_TABLET | Freq: Every day | ORAL | Status: DC

## 2014-08-28 MED ORDER — LEVOTHYROXINE SODIUM 100 MCG PO TABS: 100.0000 ug | ORAL_TABLET | Freq: Every day | ORAL | Status: DC

## 2014-08-28 NOTE — Progress Notes (Signed)
   Subjective:    Patient ID: Justin Green, male    DOB: [birth year], 49 y.o.   MRN: [id number]  HPI He is here for complete examination. He does live in [place] but prefers to come here for his care. He does have skin tags in his armpits A1c is removed. He will set up an appointment for this at a later time. He is taking his reflux medicine every other day with good results. He does have OSA and presently is using a mouth appliance which apparently is helping him snore less. This is according to his girlfriend. He continues on his blood pressure medication without difficulty. He also takes thyroid. He has had no chest pain, shortness of breath, GI issues.He has cut down tremendously on his alcohol consumption and does keep himself active. His work is going well. Family and social history as well as health maintenance and immunizations were reviewed.   Review of Systems  All other systems reviewed and are negative.      Objective:   Physical Exam BP 120/90 mmHg  Pulse 76  Ht 5\' 9"  (1.753 m)  Wt 262 lb 12.8 oz (119.205 kg)  BMI 38.79 kg/m2  SpO2 99%  General Appearance:    Alert, cooperative, no distress, appears stated age  Head:    Normocephalic, without obvious abnormality, atraumatic  Eyes:    PERRL, conjunctiva/corneas clear, EOM's intact, fundi    benign  Ears:    Normal TM's and external ear canals  Nose:   Nares normal, mucosa normal, no drainage or sinus   tenderness  Throat:   Lips, mucosa, and tongue normal; teeth and gums normal  Neck:   Supple, no lymphadenopathy;  thyroid:  no   enlargement/tenderness/nodules; no carotid   bruit or JVD  Back:    Spine nontender, no curvature, ROM normal, no CVA     tenderness  Lungs:     Clear to auscultation bilaterally without wheezes, rales or     ronchi; respirations unlabored  Chest Wall:    No tenderness or deformity   Heart:    Regular rate and rhythm, S1 and S2 normal, no murmur, rub   or gallop  Breast Exam:    No chest  wall tenderness, masses or gynecomastia  Abdomen:     Soft, non-tender, nondistended, normoactive bowel sounds,    no masses, no hepatosplenomegaly        Extremities:   No clubbing, cyanosis or edema  Pulses:   2+ and symmetric all extremities  Skin:   Skin color, texture, turgor normal, no rashes or lesions  Lymph nodes:   Cervical, supraclavicular, and axillary nodes normal  Neurologic:   CNII-XII intact, normal strength, sensation and gait; reflexes 2+ and symmetric throughout          Psych:   Normal mood, affect, hygiene and grooming.          Assessment & Plan:  Routine general medical examination at a health care facility - Plan: POCT Urinalysis Dipstick, Comprehensive metabolic panel, CBC with Differential/Platelet, Lipid panel, TSH  Essential hypertension - Plan: lisinopril-hydrochlorothiazide (PRINZIDE,ZESTORETIC) 20-12.5 MG per tablet  Gastroesophageal reflux disease without esophagitis  Other specified hypothyroidism - Plan: TSH, levothyroxine (SYNTHROID, LEVOTHROID) 100 MCG tablet  Sleep apnea  Need for prophylactic vaccination and inoculation against influenza - Plan: Flu Vaccine QUAD 36+ mos IM I again discussed diet and exercise with him to help with weight reduction. Continue on present medication regimen. Return here as needed.

## 2014-08-29 ENCOUNTER — Other Ambulatory Visit: Payer: Self-pay | Admitting: Family Medicine

## 2014-08-30 MED ORDER — LEVOTHYROXINE SODIUM 112 MCG PO TABS: 112.0000 ug | ORAL_TABLET | Freq: Every day | ORAL | Status: DC

## 2014-08-30 NOTE — Addendum Note (Signed)
Addended by: Denita Lung on: 08/30/2014 07:27 AM   Modules accepted: Orders

## 2014-08-31 LAB — HEMOGLOBIN A1C
Hgb A1c MFr Bld: 6 % — ABNORMAL HIGH (ref <5.7)
MEAN PLASMA GLUCOSE: 126 mg/dL — AB (ref <117)

## 2014-09-01 DIAGNOSIS — R7303 Prediabetes: Secondary | ICD-10-CM | POA: Insufficient documentation

## 2014-09-15 ENCOUNTER — Ambulatory Visit (INDEPENDENT_AMBULATORY_CARE_PROVIDER_SITE_OTHER): Payer: BLUE CROSS/BLUE SHIELD | Admitting: Family Medicine

## 2014-09-15 DIAGNOSIS — Q828 Other specified congenital malformations of skin: Secondary | ICD-10-CM | POA: Diagnosis not present

## 2014-09-15 DIAGNOSIS — I10 Essential (primary) hypertension: Secondary | ICD-10-CM | POA: Diagnosis not present

## 2014-09-15 MED ORDER — AMLODIPINE BESYLATE 5 MG PO TABS: 5.0000 mg | ORAL_TABLET | Freq: Every day | ORAL | Status: DC

## 2014-09-15 NOTE — Progress Notes (Signed)
   Subjective:    Patient ID: Justin Green, male    DOB: 07-18-1965, 49 y.o.   MRN: [id number]  HPI He is here for excision of multiple skin tags in the left axilla. He does not have the money right. They've been there for several years. He is also here to have his blood pressure recheck.  Review of Systems     Objective:   Physical Exam 7 skin tag is noted in the left axilla. Blood pressure is recorded.       Assessment & Plan:  Essential hypertension - Plan: amLODipine (NORVASC) 5 MG tablet  Accessory skin tags The skin tags were excised and the base hyfrecated without difficulty. I will add amlodipine to his regimen and keep him on the lisinopril. Also discussed the need for him to make dietary changes. Check here one month

## 2014-10-22 ENCOUNTER — Ambulatory Visit (INDEPENDENT_AMBULATORY_CARE_PROVIDER_SITE_OTHER): Payer: BLUE CROSS/BLUE SHIELD | Admitting: Family Medicine

## 2014-10-22 ENCOUNTER — Other Ambulatory Visit: Payer: Self-pay | Admitting: Family Medicine

## 2014-10-22 ENCOUNTER — Encounter: Payer: Self-pay | Admitting: Family Medicine

## 2014-10-22 VITALS — BP 130/90 | HR 70 | Ht 69.0 in | Wt 263.0 lb

## 2014-10-22 DIAGNOSIS — I10 Essential (primary) hypertension: Secondary | ICD-10-CM | POA: Diagnosis not present

## 2014-10-22 MED ORDER — AMLODIPINE BESYLATE 10 MG PO TABS: 10.0000 mg | ORAL_TABLET | Freq: Every day | ORAL | Status: DC

## 2014-10-22 NOTE — Progress Notes (Signed)
   Subjective:    Patient ID: Justin Green, male    DOB: Jul 30, 1965, 49 y.o.   MRN: [id number]  HPI He is here for recheck on his hypertension. He has had no difficulty with the amlodipine. Specifically no edema noted.   Review of Systems     Objective:   Physical Exam Alert and in no distress. Blood pressure is recorded.       Assessment & Plan:  Essential hypertension - Plan: DISCONTINUED: amLODipine (NORVASC) 10 MG tablet  I will increase his amlodipine. Also discussed the need for him to make lifestyle changes in regard to diet and exercise. Encourage placing emphasis on making exercise changes rather than diet at this point. Recheck here one month.

## 2014-11-24 ENCOUNTER — Ambulatory Visit (INDEPENDENT_AMBULATORY_CARE_PROVIDER_SITE_OTHER): Payer: BLUE CROSS/BLUE SHIELD | Admitting: Family Medicine

## 2014-11-24 ENCOUNTER — Encounter: Payer: Self-pay | Admitting: Family Medicine

## 2014-11-24 VITALS — BP 114/90 | HR 78 | Ht 69.0 in | Wt 266.0 lb

## 2014-11-24 DIAGNOSIS — I10 Essential (primary) hypertension: Secondary | ICD-10-CM | POA: Diagnosis not present

## 2014-11-24 DIAGNOSIS — B001 Herpesviral vesicular dermatitis: Secondary | ICD-10-CM | POA: Diagnosis not present

## 2014-11-24 MED ORDER — VALACYCLOVIR HCL 1 G PO TABS: ORAL_TABLET | ORAL | Status: DC

## 2014-11-24 MED ORDER — AZILSARTAN-CHLORTHALIDONE 40-25 MG PO TABS: 1.0000 | ORAL_TABLET | Freq: Every day | ORAL | Status: DC

## 2014-11-24 NOTE — Progress Notes (Signed)
   Subjective:    Patient ID: Justin Green, male    DOB: 26-May-1965, 49 y.o.   MRN: [id number]  HPI He is here for recheck. He has been taking his medication and has noted some slight swelling and weight gain. He also would like medication for his herpes. He in the past has used a topical medication.   Review of Systems     Objective:   Physical Exam Alert and in no distress. Blood pressure is recorded. Blistered lesion noted on the left lower lateral lip.       Assessment & Plan:  Essential hypertension - Plan: Azilsartan-Chlorthalidone 40-25 MG TABS  Herpes labialis - Plan: valACYclovir (VALTREX) 1000 MG tablet  I will switch him to [employer] and have him stop his other 2 medications. We'll also give him Valtrex. Recheck here in one month.

## 2014-11-25 ENCOUNTER — Telehealth: Payer: Self-pay | Admitting: Family Medicine

## 2014-11-25 NOTE — Telephone Encounter (Signed)
Called rep Audelia Acton & he gave list of 3 pharmacies in [place] that do the co pay program for [employer].  [facility] t# [phone] [place] t# [phone] on [address] t# [phone] on [address]

## 2014-12-29 ENCOUNTER — Encounter: Payer: Self-pay | Admitting: Family Medicine

## 2014-12-29 ENCOUNTER — Ambulatory Visit (INDEPENDENT_AMBULATORY_CARE_PROVIDER_SITE_OTHER): Payer: BLUE CROSS/BLUE SHIELD | Admitting: Family Medicine

## 2014-12-29 VITALS — BP 116/76 | HR 68 | Wt 266.6 lb

## 2014-12-29 DIAGNOSIS — I10 Essential (primary) hypertension: Secondary | ICD-10-CM

## 2014-12-29 MED ORDER — AZILSARTAN-CHLORTHALIDONE 40-25 MG PO TABS: 1.0000 | ORAL_TABLET | Freq: Every day | ORAL | Status: DC

## 2014-12-29 NOTE — Progress Notes (Signed)
   Subjective:    Patient ID: Justin Green, male    DOB: 29-Jan-1965, 49 y.o.   MRN: [id number]  HPI He is here for a recheck. He is now on Edarbychlor 40/25. He is having no problems with this.   Review of Systems     Objective:   Physical Exam Alert and in no distress. Blood pressure is recorded.       Assessment & Plan:  Essential hypertension  he now is under much better control. He will be getting his prescription from the pharmacy in [place] where she now lives. I did write him a prescription today.

## 2015-09-08 ENCOUNTER — Other Ambulatory Visit: Payer: Self-pay | Admitting: Family Medicine

## 2015-11-16 ENCOUNTER — Encounter: Payer: Self-pay | Admitting: Family Medicine

## 2015-11-16 ENCOUNTER — Ambulatory Visit (INDEPENDENT_AMBULATORY_CARE_PROVIDER_SITE_OTHER): Payer: BLUE CROSS/BLUE SHIELD | Admitting: Family Medicine

## 2015-11-16 VITALS — BP 118/80 | HR 78 | Ht 69.0 in | Wt 260.8 lb

## 2015-11-16 DIAGNOSIS — M7581 Other shoulder lesions, right shoulder: Secondary | ICD-10-CM | POA: Diagnosis not present

## 2015-11-16 DIAGNOSIS — E038 Other specified hypothyroidism: Secondary | ICD-10-CM

## 2015-11-16 DIAGNOSIS — R7303 Prediabetes: Secondary | ICD-10-CM | POA: Diagnosis not present

## 2015-11-16 DIAGNOSIS — B001 Herpesviral vesicular dermatitis: Secondary | ICD-10-CM

## 2015-11-16 DIAGNOSIS — Z23 Encounter for immunization: Secondary | ICD-10-CM | POA: Diagnosis not present

## 2015-11-16 DIAGNOSIS — I10 Essential (primary) hypertension: Secondary | ICD-10-CM | POA: Diagnosis not present

## 2015-11-16 LAB — LIPID PANEL
Cholesterol: 180 mg/dL (ref <200)
HDL: 38 mg/dL — ABNORMAL LOW (ref >40)
LDL CALC: 113 mg/dL — AB (ref <100)
TRIGLYCERIDES: 147 mg/dL (ref <150)
Total CHOL/HDL Ratio: 4.7 Ratio (ref <5.0)
VLDL: 29 mg/dL (ref <30)

## 2015-11-16 LAB — COMPREHENSIVE METABOLIC PANEL
ALBUMIN: 4.7 g/dL (ref 3.6–5.1)
ALT: 29 U/L (ref 9–46)
AST: 21 U/L (ref 10–35)
Alkaline Phosphatase: 64 U/L (ref 40–115)
BILIRUBIN TOTAL: 0.3 mg/dL (ref 0.2–1.2)
BUN: 31 mg/dL — ABNORMAL HIGH (ref 7–25)
CHLORIDE: 102 mmol/L (ref 98–110)
CO2: 27 mmol/L (ref 20–31)
CREATININE: 1.31 mg/dL (ref 0.70–1.33)
Calcium: 9.3 mg/dL (ref 8.6–10.3)
GLUCOSE: 91 mg/dL (ref 65–99)
Potassium: 4.2 mmol/L (ref 3.5–5.3)
SODIUM: 139 mmol/L (ref 135–146)
Total Protein: 7.4 g/dL (ref 6.1–8.1)

## 2015-11-16 LAB — CBC WITH DIFFERENTIAL/PLATELET
BASOS ABS: 0 {cells}/uL (ref 0–200)
Basophils Relative: 0 %
EOS PCT: 2 %
Eosinophils Absolute: 204 cells/uL (ref 15–500)
HCT: 44.2 % (ref 38.5–50.0)
Hemoglobin: 14.7 g/dL (ref 13.2–17.1)
LYMPHS ABS: 2142 {cells}/uL (ref 850–3900)
LYMPHS PCT: 21 %
MCH: 28.9 pg (ref 27.0–33.0)
MCHC: 33.3 g/dL (ref 32.0–36.0)
MCV: 86.8 fL (ref 80.0–100.0)
MONOS PCT: 8 %
MPV: 11.1 fL (ref 7.5–12.5)
Monocytes Absolute: 816 cells/uL (ref 200–950)
NEUTROS PCT: 69 %
Neutro Abs: 7038 cells/uL (ref 1500–7800)
PLATELETS: 243 10*3/uL (ref 140–400)
RBC: 5.09 MIL/uL (ref 4.20–5.80)
RDW: 13.3 % (ref 11.0–15.0)
WBC: 10.2 10*3/uL (ref 4.0–10.5)

## 2015-11-16 LAB — TSH: TSH: 11.6 mIU/L — ABNORMAL HIGH (ref 0.40–4.50)

## 2015-11-16 MED ORDER — AZILSARTAN-CHLORTHALIDONE 40-25 MG PO TABS: 1.0000 | ORAL_TABLET | Freq: Every day | ORAL | 11 refills | Status: DC

## 2015-11-16 MED ORDER — VALACYCLOVIR HCL 1 G PO TABS: ORAL_TABLET | ORAL | 2 refills | Status: DC

## 2015-11-16 MED ORDER — TRIAMCINOLONE ACETONIDE 40 MG/ML IJ SUSP
40.0000 mg | Freq: Once | INTRAMUSCULAR | Status: AC
  Administered 2015-11-16: 40 mg via INTRAMUSCULAR

## 2015-11-16 MED ORDER — LEVOTHYROXINE SODIUM 112 MCG PO TABS: ORAL_TABLET | ORAL | 3 refills | Status: DC

## 2015-11-16 MED ORDER — LIDOCAINE HCL 1 % IJ SOLN
3.0000 mL | Freq: Once | INTRAMUSCULAR | Status: AC
  Administered 2015-11-16: 3 mL via INTRADERMAL

## 2015-11-16 NOTE — Progress Notes (Signed)
   Subjective:    Patient ID: Justin Green, male    DOB: 05-29-1965, 50 y.o.   MRN: [id number]  HPI He is here to get his medications renewed. He continues on his blood pressure medication and is having no difficulty with this. He does occasionally use Valtrex to help with herpes labialis. He continues on his Synthroid having no difficulty with cold intolerance, skin changes. He also has a history of prediabetes. His weight has not changed much. His work schedule does interfere according hip was taking better care of himself. He also complains of a several month history of right shoulder pain especially with abduction and external rotation. He has had previous difficulty with shoulder as well as other joint discomfort but has never had any true injury. He is now having more pain and this is interfering with his ability to function. He also is noted pain even with playing golf. It occasionally will wake him up.   Review of Systems     Objective:   Physical Exam Full motion of the shoulder. Drop arm test was uncomfortable. Supraspinatus testing was negative. Neer's and Hawkins test was uncomfortable. Sulcus sign negative. Questionable anterior laxity noted. Hemoglobin A1c is 6.2      Assessment & Plan:  Rotator cuff tendinitis, right - Plan: triamcinolone acetonide (KENALOG-40) injection 40 mg, lidocaine (XYLOCAINE) 1 % (with pres) injection 3 mL  Need for prophylactic vaccination and inoculation against influenza - Plan: Flu Vaccine QUAD 36+ mos PF IM (Fluarix & Fluzone Quad PF)  Essential hypertension - Plan: Azilsartan-Chlorthalidone 40-25 MG TABS, CBC with Differential/Platelet, Comprehensive metabolic panel  Herpes labialis - Plan: valACYclovir (VALTREX) 1000 MG tablet  Other specified hypothyroidism - Plan: levothyroxine (SYNTHROID, LEVOTHROID) 112 MCG tablet, TSH  Prediabetes - Plan: Lipid panel, POCT glycosylated hemoglobin (Hb A1C) Scuffs treatment of the shoulder. We will  try the injection and see if this gives him relief and this so we'll forward with rehabilitation. He might need further evaluation including ultrasound to truly assess was going on in there. It is not necessarily clearcut rotator cuff tendinitis. He'll continue on his other medications. I again stressed the need for him to make diet and exercise changes. He is also return here in roughly 1 month for complete examination.

## 2015-11-17 ENCOUNTER — Encounter: Payer: Self-pay | Admitting: Family Medicine

## 2015-11-17 LAB — POCT GLYCOSYLATED HEMOGLOBIN (HGB A1C): Hemoglobin A1C: 6.2

## 2015-12-06 ENCOUNTER — Other Ambulatory Visit: Payer: Self-pay | Admitting: Family Medicine

## 2015-12-06 DIAGNOSIS — I10 Essential (primary) hypertension: Secondary | ICD-10-CM

## 2015-12-16 ENCOUNTER — Telehealth: Payer: Self-pay | Admitting: Family Medicine

## 2015-12-16 NOTE — Telephone Encounter (Signed)
Recv'd response no P.A. Needed covered medication, called pharmacy & went thru

## 2015-12-20 ENCOUNTER — Other Ambulatory Visit: Payer: Self-pay | Admitting: Family Medicine

## 2015-12-20 DIAGNOSIS — B001 Herpesviral vesicular dermatitis: Secondary | ICD-10-CM

## 2015-12-22 ENCOUNTER — Ambulatory Visit (INDEPENDENT_AMBULATORY_CARE_PROVIDER_SITE_OTHER): Payer: BLUE CROSS/BLUE SHIELD | Admitting: Family Medicine

## 2015-12-22 ENCOUNTER — Encounter: Payer: Self-pay | Admitting: Family Medicine

## 2015-12-22 VITALS — BP 120/78 | HR 68 | Temp 98.2°F | Ht 69.0 in | Wt 255.6 lb

## 2015-12-22 DIAGNOSIS — E038 Other specified hypothyroidism: Secondary | ICD-10-CM | POA: Diagnosis not present

## 2015-12-22 DIAGNOSIS — K219 Gastro-esophageal reflux disease without esophagitis: Secondary | ICD-10-CM | POA: Diagnosis not present

## 2015-12-22 DIAGNOSIS — I10 Essential (primary) hypertension: Secondary | ICD-10-CM

## 2015-12-22 DIAGNOSIS — G8929 Other chronic pain: Secondary | ICD-10-CM | POA: Diagnosis not present

## 2015-12-22 DIAGNOSIS — R7303 Prediabetes: Secondary | ICD-10-CM

## 2015-12-22 DIAGNOSIS — J069 Acute upper respiratory infection, unspecified: Secondary | ICD-10-CM | POA: Diagnosis not present

## 2015-12-22 DIAGNOSIS — B001 Herpesviral vesicular dermatitis: Secondary | ICD-10-CM

## 2015-12-22 DIAGNOSIS — Z0000 Encounter for general adult medical examination without abnormal findings: Secondary | ICD-10-CM | POA: Diagnosis not present

## 2015-12-22 DIAGNOSIS — M25511 Pain in right shoulder: Secondary | ICD-10-CM | POA: Diagnosis not present

## 2015-12-22 NOTE — Progress Notes (Addendum)
Subjective:    Patient ID: Justin Green, male    DOB: Mar 21, 1965, 50 y.o.   MRN: [id number]  HPI He is here for complete examination. He continues to have right shoulder pain. He notes a feeling of instability especially at night and has been awakened from the pain. Also certain maneuvers tend to make this pain much worse he sometimes feels as if it is coming out of joint... He did have an injection which helped only for short period time. He does have underlying reflux disease and is using medicine on an every other day basis. He has Valtrex for when he has a herpes outbreak on his lips. He does have underlying OSA but the parameters were such that he did not need to be on CPAP. He has had elevated blood sugars in the past with her A1c in the 6 range. He has lost several pounds within the last month or so. He is having difficulty with sore throat and sinus congestion. He is not having slightly productive cough over the last several days. He did have an elevated TSH however in the last month he has been very careful both take the medicine on an empty stomach. He continues on his blood pressure medication. He's had no difficulty with chest pain, shortness breath or urinary issues.   Review of Systems  All other systems reviewed and are negative.      Objective:   Physical Exam BP 120/78   Pulse 68   Temp 98.2 F (36.8 C) (Oral)   Ht 5\' 9"  (1.753 m)   Wt 255 lb 9.6 oz (115.9 kg)   BMI 37.75 kg/m   General Appearance:    Alert, cooperative, no distress, appears stated age  Head:    Normocephalic, without obvious abnormality, atraumatic  Eyes:    PERRL, conjunctiva/corneas clear, EOM's intact, fundi    benign  Ears:    Normal TM's and external ear canals  Nose:   Nares normal, mucosa normal, no drainage or sinus   tenderness  Throat:   Lips, mucosa, and tongue normal; teeth and gums normal  Neck:   Supple, no lymphadenopathy;  thyroid:  no   enlargement/tenderness/nodules; no  carotid   bruit or JVD  Back:    Spine nontender, no curvature, ROM normal, no CVA     tenderness  Lungs:     Clear to auscultation bilaterally without wheezes, rales or     ronchi; respirations unlabored      Heart:    Regular rate and rhythm, S1 and S2 normal, no murmur, rub   or gallop     Abdomen:     Soft, non-tender, nondistended, normoactive bowel sounds,    no masses, no hepatosplenomegaly  Genitalia:    Normal male external genitalia without lesions.  Testicles without masses.  No inguinal hernias.  Rectal:    Extremities:   No clubbing, cyanosis or edema. Right shoulder exam shows full motion of the shoulder. Negative drop arm test. Supraspinatus testing negative. Negative sulcus sign. No tenderness over biceps tendon or before meals joint. O'Brien's test was uncomfortable.   Pulses:   2+ and symmetric all extremities  Skin:   Skin color, texture, turgor normal, no rashes or lesions  Lymph nodes:   Cervical, supraclavicular, and axillary nodes normal  Neurologic:   CNII-XII intact, normal strength, sensation and gait; reflexes 2+ and symmetric throughout          Psych:   Normal mood, affect, hygiene and  grooming.          Assessment & Plan:  Routine general medical examination at a health care facility  Essential hypertension  Herpes labialis  Other specified hypothyroidism - Plan: TSH  Prediabetes  Gastroesophageal reflux disease without esophagitis  Chronic right shoulder pain  Acute URI Conservative care for the URI. He will need further evaluation of the shoulder and I will arrange for him to be seen in [place]. It does indeed sound like he might have a SLAP lesion. Continue on his other medications. Encouraged him to continue with weight loss and exercise. Discussed colonoscopy with him and we will wait till next year to see if his insurance will cover the Cologuard. He is comfortable with that. Continue on present medications. The TSH is still elevated. I  will increase his Synthroid and have him return here in 2 months for recheck.

## 2015-12-23 LAB — TSH: TSH: 9.05 m[IU]/L — AB (ref 0.40–4.50)

## 2015-12-23 MED ORDER — LEVOTHYROXINE SODIUM 125 MCG PO TABS: 125.0000 ug | ORAL_TABLET | Freq: Every day | ORAL | 3 refills | Status: DC

## 2015-12-23 NOTE — Addendum Note (Signed)
Addended by: Denita Lung on: 12/23/2015 07:46 AM   Modules accepted: Orders

## 2015-12-28 ENCOUNTER — Encounter: Payer: Self-pay | Admitting: Family Medicine

## 2016-01-04 DIAGNOSIS — M25511 Pain in right shoulder: Secondary | ICD-10-CM | POA: Diagnosis not present

## 2016-01-06 DIAGNOSIS — M25511 Pain in right shoulder: Secondary | ICD-10-CM | POA: Diagnosis not present

## 2016-01-12 DIAGNOSIS — G8918 Other acute postprocedural pain: Secondary | ICD-10-CM | POA: Diagnosis not present

## 2016-01-12 DIAGNOSIS — S43431A Superior glenoid labrum lesion of right shoulder, initial encounter: Secondary | ICD-10-CM | POA: Diagnosis not present

## 2016-01-12 DIAGNOSIS — M25311 Other instability, right shoulder: Secondary | ICD-10-CM | POA: Diagnosis not present

## 2016-01-12 DIAGNOSIS — M7581 Other shoulder lesions, right shoulder: Secondary | ICD-10-CM | POA: Diagnosis not present

## 2016-01-12 DIAGNOSIS — M75111 Incomplete rotator cuff tear or rupture of right shoulder, not specified as traumatic: Secondary | ICD-10-CM | POA: Diagnosis not present

## 2016-01-12 DIAGNOSIS — M19011 Primary osteoarthritis, right shoulder: Secondary | ICD-10-CM | POA: Diagnosis not present

## 2016-01-12 DIAGNOSIS — M24111 Other articular cartilage disorders, right shoulder: Secondary | ICD-10-CM | POA: Diagnosis not present

## 2016-01-12 DIAGNOSIS — M24611 Ankylosis, right shoulder: Secondary | ICD-10-CM | POA: Diagnosis not present

## 2016-01-12 DIAGNOSIS — M7501 Adhesive capsulitis of right shoulder: Secondary | ICD-10-CM | POA: Diagnosis not present

## 2016-01-16 ENCOUNTER — Encounter: Payer: Self-pay | Admitting: Family Medicine

## 2016-02-02 DIAGNOSIS — M25611 Stiffness of right shoulder, not elsewhere classified: Secondary | ICD-10-CM | POA: Diagnosis not present

## 2016-02-02 DIAGNOSIS — M6281 Muscle weakness (generalized): Secondary | ICD-10-CM | POA: Diagnosis not present

## 2016-02-09 DIAGNOSIS — M6281 Muscle weakness (generalized): Secondary | ICD-10-CM | POA: Diagnosis not present

## 2016-02-09 DIAGNOSIS — M25611 Stiffness of right shoulder, not elsewhere classified: Secondary | ICD-10-CM | POA: Diagnosis not present

## 2016-02-11 DIAGNOSIS — M25611 Stiffness of right shoulder, not elsewhere classified: Secondary | ICD-10-CM | POA: Diagnosis not present

## 2016-02-11 DIAGNOSIS — M6281 Muscle weakness (generalized): Secondary | ICD-10-CM | POA: Diagnosis not present

## 2016-02-14 DIAGNOSIS — M6281 Muscle weakness (generalized): Secondary | ICD-10-CM | POA: Diagnosis not present

## 2016-02-14 DIAGNOSIS — M25611 Stiffness of right shoulder, not elsewhere classified: Secondary | ICD-10-CM | POA: Diagnosis not present

## 2016-02-17 DIAGNOSIS — M6281 Muscle weakness (generalized): Secondary | ICD-10-CM | POA: Diagnosis not present

## 2016-02-17 DIAGNOSIS — M25611 Stiffness of right shoulder, not elsewhere classified: Secondary | ICD-10-CM | POA: Diagnosis not present

## 2016-02-21 DIAGNOSIS — M6281 Muscle weakness (generalized): Secondary | ICD-10-CM | POA: Diagnosis not present

## 2016-02-21 DIAGNOSIS — M25611 Stiffness of right shoulder, not elsewhere classified: Secondary | ICD-10-CM | POA: Diagnosis not present

## 2016-02-24 DIAGNOSIS — M6281 Muscle weakness (generalized): Secondary | ICD-10-CM | POA: Diagnosis not present

## 2016-02-24 DIAGNOSIS — M25611 Stiffness of right shoulder, not elsewhere classified: Secondary | ICD-10-CM | POA: Diagnosis not present

## 2016-02-28 DIAGNOSIS — M25611 Stiffness of right shoulder, not elsewhere classified: Secondary | ICD-10-CM | POA: Diagnosis not present

## 2016-02-28 DIAGNOSIS — M6281 Muscle weakness (generalized): Secondary | ICD-10-CM | POA: Diagnosis not present

## 2016-03-02 DIAGNOSIS — M6281 Muscle weakness (generalized): Secondary | ICD-10-CM | POA: Diagnosis not present

## 2016-03-02 DIAGNOSIS — M25611 Stiffness of right shoulder, not elsewhere classified: Secondary | ICD-10-CM | POA: Diagnosis not present

## 2016-03-06 DIAGNOSIS — M6281 Muscle weakness (generalized): Secondary | ICD-10-CM | POA: Diagnosis not present

## 2016-03-06 DIAGNOSIS — M25611 Stiffness of right shoulder, not elsewhere classified: Secondary | ICD-10-CM | POA: Diagnosis not present

## 2016-03-09 DIAGNOSIS — M6281 Muscle weakness (generalized): Secondary | ICD-10-CM | POA: Diagnosis not present

## 2016-03-09 DIAGNOSIS — M25611 Stiffness of right shoulder, not elsewhere classified: Secondary | ICD-10-CM | POA: Diagnosis not present

## 2016-03-13 DIAGNOSIS — M6281 Muscle weakness (generalized): Secondary | ICD-10-CM | POA: Diagnosis not present

## 2016-03-13 DIAGNOSIS — M25611 Stiffness of right shoulder, not elsewhere classified: Secondary | ICD-10-CM | POA: Diagnosis not present

## 2016-03-16 DIAGNOSIS — M25611 Stiffness of right shoulder, not elsewhere classified: Secondary | ICD-10-CM | POA: Diagnosis not present

## 2016-03-16 DIAGNOSIS — M6281 Muscle weakness (generalized): Secondary | ICD-10-CM | POA: Diagnosis not present

## 2016-03-20 DIAGNOSIS — M25611 Stiffness of right shoulder, not elsewhere classified: Secondary | ICD-10-CM | POA: Diagnosis not present

## 2016-03-20 DIAGNOSIS — M6281 Muscle weakness (generalized): Secondary | ICD-10-CM | POA: Diagnosis not present

## 2016-03-23 DIAGNOSIS — M25611 Stiffness of right shoulder, not elsewhere classified: Secondary | ICD-10-CM | POA: Diagnosis not present

## 2016-03-23 DIAGNOSIS — M6281 Muscle weakness (generalized): Secondary | ICD-10-CM | POA: Diagnosis not present

## 2016-03-27 DIAGNOSIS — M25611 Stiffness of right shoulder, not elsewhere classified: Secondary | ICD-10-CM | POA: Diagnosis not present

## 2016-03-27 DIAGNOSIS — M6281 Muscle weakness (generalized): Secondary | ICD-10-CM | POA: Diagnosis not present

## 2016-03-30 DIAGNOSIS — M6281 Muscle weakness (generalized): Secondary | ICD-10-CM | POA: Diagnosis not present

## 2016-03-30 DIAGNOSIS — M25611 Stiffness of right shoulder, not elsewhere classified: Secondary | ICD-10-CM | POA: Diagnosis not present

## 2016-04-03 DIAGNOSIS — M25611 Stiffness of right shoulder, not elsewhere classified: Secondary | ICD-10-CM | POA: Diagnosis not present

## 2016-04-03 DIAGNOSIS — M6281 Muscle weakness (generalized): Secondary | ICD-10-CM | POA: Diagnosis not present

## 2016-04-06 DIAGNOSIS — M25611 Stiffness of right shoulder, not elsewhere classified: Secondary | ICD-10-CM | POA: Diagnosis not present

## 2016-04-06 DIAGNOSIS — M6281 Muscle weakness (generalized): Secondary | ICD-10-CM | POA: Diagnosis not present

## 2016-04-11 DIAGNOSIS — M25611 Stiffness of right shoulder, not elsewhere classified: Secondary | ICD-10-CM | POA: Diagnosis not present

## 2016-04-11 DIAGNOSIS — M6281 Muscle weakness (generalized): Secondary | ICD-10-CM | POA: Diagnosis not present

## 2016-04-13 DIAGNOSIS — M25611 Stiffness of right shoulder, not elsewhere classified: Secondary | ICD-10-CM | POA: Diagnosis not present

## 2016-04-13 DIAGNOSIS — M6281 Muscle weakness (generalized): Secondary | ICD-10-CM | POA: Diagnosis not present

## 2016-04-18 DIAGNOSIS — M25611 Stiffness of right shoulder, not elsewhere classified: Secondary | ICD-10-CM | POA: Diagnosis not present

## 2016-04-18 DIAGNOSIS — M6281 Muscle weakness (generalized): Secondary | ICD-10-CM | POA: Diagnosis not present

## 2016-04-20 DIAGNOSIS — M25611 Stiffness of right shoulder, not elsewhere classified: Secondary | ICD-10-CM | POA: Diagnosis not present

## 2016-04-20 DIAGNOSIS — M6281 Muscle weakness (generalized): Secondary | ICD-10-CM | POA: Diagnosis not present

## 2016-05-01 DIAGNOSIS — M6281 Muscle weakness (generalized): Secondary | ICD-10-CM | POA: Diagnosis not present

## 2016-10-23 ENCOUNTER — Telehealth: Payer: Self-pay | Admitting: Family Medicine

## 2016-10-23 DIAGNOSIS — Z1211 Encounter for screening for malignant neoplasm of colon: Secondary | ICD-10-CM

## 2016-10-23 NOTE — Telephone Encounter (Signed)
Go ahead and set him up for colonoscopy

## 2016-10-23 NOTE — Telephone Encounter (Signed)
Pt called and made a cpe appt for end of December. Pt would like to have a colonoscopy or cologuard before the end of the year. He states he has been previously told he could not have cologuard due to his insurance. Please advise pt at [phone].

## 2016-10-23 NOTE — Telephone Encounter (Signed)
Ive put referral in to [facility] for colonoscopy

## 2016-10-24 ENCOUNTER — Encounter: Payer: Self-pay | Admitting: Internal Medicine

## 2016-10-24 ENCOUNTER — Ambulatory Visit (INDEPENDENT_AMBULATORY_CARE_PROVIDER_SITE_OTHER): Payer: BLUE CROSS/BLUE SHIELD | Admitting: Family Medicine

## 2016-10-24 ENCOUNTER — Encounter: Payer: Self-pay | Admitting: Family Medicine

## 2016-10-24 VITALS — BP 110/78 | HR 88 | Wt 270.4 lb

## 2016-10-24 DIAGNOSIS — M25562 Pain in left knee: Secondary | ICD-10-CM

## 2016-10-24 DIAGNOSIS — Z23 Encounter for immunization: Secondary | ICD-10-CM | POA: Diagnosis not present

## 2016-10-24 NOTE — Progress Notes (Signed)
   Subjective:    Patient ID: Justin Green, male    DOB: 10-Aug-1965, 51 y.o.   MRN: [id number]  HPI He is here for evaluation of intermittent difficulty with left knee pain. Initially he was noticing pain while using the steps per could not tell whether it was going up or down. The pain initially was anterior and now he is noticing more along the left medial joint line but no popping, locking or grinding.   Review of Systems     Objective:   Physical Exam Alert and in no distress. No effusion is noted. Slight tenderness over the medial joint line. Patellar tendon is normal. Negative compression test. McMurray's and anterior drawer was negative.       Assessment & Plan:  Need for influenza vaccination - Plan: Flu Vaccine QUAD 6+ mos PF IM (Fluarix Quad PF)  Left knee pain, unspecified chronicity I explained that this is probably a degenerative joint issue with his meniscus. Discussed proper care of this and the fact that surgical intervention is probably not useful. Discussed possibly getting x-ray and using steroids to help control his and he was comfortable with that approach.

## 2016-10-26 ENCOUNTER — Ambulatory Visit: Payer: BLUE CROSS/BLUE SHIELD | Admitting: Family Medicine

## 2016-12-14 ENCOUNTER — Other Ambulatory Visit: Payer: Self-pay | Admitting: Family Medicine

## 2016-12-14 DIAGNOSIS — I10 Essential (primary) hypertension: Secondary | ICD-10-CM

## 2016-12-28 ENCOUNTER — Encounter: Payer: Self-pay | Admitting: Family Medicine

## 2016-12-28 ENCOUNTER — Ambulatory Visit: Payer: BLUE CROSS/BLUE SHIELD | Admitting: Family Medicine

## 2016-12-28 ENCOUNTER — Encounter: Payer: Self-pay | Admitting: Internal Medicine

## 2016-12-28 VITALS — BP 130/80 | HR 76 | Resp 16 | Ht 69.25 in | Wt 271.8 lb

## 2016-12-28 DIAGNOSIS — Z0000 Encounter for general adult medical examination without abnormal findings: Secondary | ICD-10-CM | POA: Diagnosis not present

## 2016-12-28 DIAGNOSIS — M7711 Lateral epicondylitis, right elbow: Secondary | ICD-10-CM | POA: Diagnosis not present

## 2016-12-28 DIAGNOSIS — R7303 Prediabetes: Secondary | ICD-10-CM | POA: Diagnosis not present

## 2016-12-28 DIAGNOSIS — Z8619 Personal history of other infectious and parasitic diseases: Secondary | ICD-10-CM

## 2016-12-28 DIAGNOSIS — G4730 Sleep apnea, unspecified: Secondary | ICD-10-CM | POA: Diagnosis not present

## 2016-12-28 DIAGNOSIS — K219 Gastro-esophageal reflux disease without esophagitis: Secondary | ICD-10-CM

## 2016-12-28 DIAGNOSIS — I10 Essential (primary) hypertension: Secondary | ICD-10-CM

## 2016-12-28 DIAGNOSIS — E038 Other specified hypothyroidism: Secondary | ICD-10-CM

## 2016-12-28 DIAGNOSIS — M25562 Pain in left knee: Secondary | ICD-10-CM | POA: Diagnosis not present

## 2016-12-28 DIAGNOSIS — N529 Male erectile dysfunction, unspecified: Secondary | ICD-10-CM | POA: Diagnosis not present

## 2016-12-28 LAB — POCT URINALYSIS DIP (PROADVANTAGE DEVICE)
BILIRUBIN UA: NEGATIVE
BILIRUBIN UA: NEGATIVE mg/dL
Blood, UA: NEGATIVE
GLUCOSE UA: NEGATIVE mg/dL
Leukocytes, UA: NEGATIVE
Nitrite, UA: NEGATIVE
Protein Ur, POC: NEGATIVE mg/dL
SPECIFIC GRAVITY, URINE: 1.03
Urobilinogen, Ur: NEGATIVE
pH, UA: 6 (ref 5.0–8.0)

## 2016-12-28 MED ORDER — AZILSARTAN-CHLORTHALIDONE 40-25 MG PO TABS: 1.0000 | ORAL_TABLET | Freq: Every day | ORAL | 11 refills | Status: DC

## 2016-12-28 MED ORDER — SILDENAFIL CITRATE 100 MG PO TABS: 50.0000 mg | ORAL_TABLET | Freq: Every day | ORAL | 11 refills | Status: DC | PRN

## 2016-12-28 MED ORDER — OMEPRAZOLE MAGNESIUM 20 MG PO TBEC: 20.0000 mg | DELAYED_RELEASE_TABLET | Freq: Every day | ORAL | 3 refills | Status: DC

## 2016-12-28 NOTE — Progress Notes (Addendum)
Subjective:    Patient ID: Justin Green, male    DOB: [birth year], 51 y.o.   MRN: [id number]  HPI He is here for complete examination.  He was supposed to have a colonoscopy with this was not done.  He would like this rescheduled.  He also continues to complain of right shoulder pain and has had previous surgery on his shoulder.  He was told that this was going to be a chronic problem.  He continues to have left knee pain but states that he is living with this and not interested in further intervention.  He does complain of left lateral elbow pain especially when he plays golf or lift things.  This is been intermittent in nature.  He continues on his blood pressure medication.  He also uses Prilosec on an as-needed basis for his reflux.  He has had difficulty recently with erectile dysfunction he states that he can get but cannot maintain an erection.  Presently he is taking thyroid on an empty stomach and does still feel some fatigue.  He does have underlying OSA but not at a high enough level to require CPAP.  He has a previous history of herpes labialis but is not had Valtrex in the recent past.  His work is going well.  Otherwise his family and social history as well as health maintenance immunizations were reviewed.   Review of Systems  All other systems reviewed and are negative.      Objective:   Physical Exam BP 130/80   Pulse 76   Resp 16   Ht 5' 9.25" (1.759 m)   Wt 271 lb 12.8 oz (123.3 kg)   SpO2 96%   BMI 39.85 kg/m   General Appearance:    Alert, cooperative, no distress, appears stated age  Head:    Normocephalic, without obvious abnormality, atraumatic  Eyes:    PERRL, conjunctiva/corneas clear, EOM's intact, fundi    benign  Ears:    Normal TM's and external ear canals  Nose:   Nares normal, mucosa normal, no drainage or sinus   tenderness  Throat:   Lips, mucosa, and tongue normal; teeth and gums normal  Neck:   Supple, no lymphadenopathy;  thyroid:  no    enlargement/tenderness/nodules; no carotid   bruit or JVD     Lungs:     Clear to auscultation bilaterally without wheezes, rales or     ronchi; respirations unlabored      Heart:    Regular rate and rhythm, S1 and S2 normal, no murmur, rub   or gallop     Abdomen:     Soft, non-tender, nondistended, normoactive bowel sounds,    no masses, no hepatosplenomegaly  Genitalia:    Normal male external genitalia without lesions.  Testicles without masses.  No inguinal hernias.  Rectal:   Deferred.  Extremities:   No clubbing, cyanosis or edema.  Tender to palpation over the left lateral epicondyle.  Full motion of the elbow.  No tenderness in other areas.  Pulses:   2+ and symmetric all extremities  Skin:   Skin color, texture, turgor normal, no rashes or lesions  Lymph nodes:   Cervical, supraclavicular, and axillary nodes normal  Neurologic:   CNII-XII intact, normal strength, sensation and gait; reflexes 2+ and symmetric throughout          Psych:   Normal mood, affect, hygiene and grooming.           Assessment & Plan:  Routine general medical examination at a health care facility - Plan: CBC with Differential/Platelet, Comprehensive metabolic panel, Lipid panel  Lateral epicondylitis of right elbow  Left knee pain, unspecified chronicity  Essential hypertension - Plan: CBC with Differential/Platelet, Comprehensive metabolic panel, Azilsartan-Chlorthalidone (EDARBYCLOR) 40-25 MG TABS  Gastroesophageal reflux disease without esophagitis - Plan: omeprazole (PRILOSEC OTC) 20 MG tablet  Other specified hypothyroidism - Plan: TSH  Prediabetes - Plan: CBC with Differential/Platelet, Comprehensive metabolic panel, Lipid panel  Sleep apnea, unspecified type  Erectile dysfunction, unspecified erectile dysfunction type - Plan: sildenafil (VIAGRA) 100 MG tablet  History of herpes labialis I discussed treatment of the lateral epicondylitis in terms of doing his many things palms up and  open as possible.  Also discussed changing his grips on his golf clubs however he will hold off on that.  He is not interested in any further intervention concerning his knee and recognizes the shoulder is as good as it will get.  I will renew his blood pressure medications.  He is also taking the Prilosec regularly.  We will hold off on filling his thyroid into his see the TSH.  Discussed the use of Viagra and possible side effects.  Also mention the possibility getting a 20 mg pill. Also discussed diet and exercise with him in regard to risk for diabetes. His TSH is elevated and I will therefore increase the dosing and have him return here in 2 months for recheck

## 2016-12-28 NOTE — Addendum Note (Signed)
Addended by: Arley Phenix L on: 12/28/2016 03:38 PM   Modules accepted: Orders

## 2016-12-29 LAB — CBC WITH DIFFERENTIAL/PLATELET
BASOS ABS: 74 {cells}/uL (ref 0–200)
Basophils Relative: 0.7 %
EOS ABS: 147 {cells}/uL (ref 15–500)
Eosinophils Relative: 1.4 %
HCT: 45.3 % (ref 38.5–50.0)
Hemoglobin: 15.5 g/dL (ref 13.2–17.1)
Lymphs Abs: 1985 cells/uL (ref 850–3900)
MCH: 29.5 pg (ref 27.0–33.0)
MCHC: 34.2 g/dL (ref 32.0–36.0)
MCV: 86.1 fL (ref 80.0–100.0)
MONOS PCT: 7.4 %
MPV: 11.7 fL (ref 7.5–12.5)
Neutro Abs: 7518 cells/uL (ref 1500–7800)
Neutrophils Relative %: 71.6 %
PLATELETS: 242 10*3/uL (ref 140–400)
RBC: 5.26 10*6/uL (ref 4.20–5.80)
RDW: 12 % (ref 11.0–15.0)
TOTAL LYMPHOCYTE: 18.9 %
WBC mixed population: 777 cells/uL (ref 200–950)
WBC: 10.5 10*3/uL (ref 3.8–10.8)

## 2016-12-29 LAB — COMPREHENSIVE METABOLIC PANEL
AG Ratio: 1.7 (calc) (ref 1.0–2.5)
ALBUMIN MSPROF: 4.7 g/dL (ref 3.6–5.1)
ALKALINE PHOSPHATASE (APISO): 76 U/L (ref 40–115)
ALT: 26 U/L (ref 9–46)
AST: 18 U/L (ref 10–35)
BUN: 24 mg/dL (ref 7–25)
CO2: 28 mmol/L (ref 20–32)
CREATININE: 1.21 mg/dL (ref 0.70–1.33)
Calcium: 9.9 mg/dL (ref 8.6–10.3)
Chloride: 101 mmol/L (ref 98–110)
Globulin: 2.7 g/dL (calc) (ref 1.9–3.7)
Glucose, Bld: 108 mg/dL — ABNORMAL HIGH (ref 65–99)
POTASSIUM: 4.3 mmol/L (ref 3.5–5.3)
Sodium: 139 mmol/L (ref 135–146)
Total Bilirubin: 0.5 mg/dL (ref 0.2–1.2)
Total Protein: 7.4 g/dL (ref 6.1–8.1)

## 2016-12-29 LAB — LIPID PANEL
CHOL/HDL RATIO: 4.6 (calc) (ref <5.0)
CHOLESTEROL: 209 mg/dL — AB (ref <200)
HDL: 45 mg/dL (ref >40)
LDL CHOLESTEROL (CALC): 136 mg/dL — AB
Non-HDL Cholesterol (Calc): 164 mg/dL (calc) — ABNORMAL HIGH (ref <130)
TRIGLYCERIDES: 153 mg/dL — AB (ref <150)

## 2016-12-29 LAB — TSH: TSH: 5.84 mIU/L — ABNORMAL HIGH (ref 0.40–4.50)

## 2016-12-29 MED ORDER — LEVOTHYROXINE SODIUM 150 MCG PO TABS: 150.0000 ug | ORAL_TABLET | Freq: Every day | ORAL | 0 refills | Status: DC

## 2016-12-29 NOTE — Addendum Note (Signed)
Addended by: Denita Lung on: 12/29/2016 07:57 AM   Modules accepted: Orders

## 2017-01-02 HISTORY — PX: COLONOSCOPY: SHX174

## 2017-01-05 ENCOUNTER — Ambulatory Visit (AMBULATORY_SURGERY_CENTER): Payer: Self-pay | Admitting: *Deleted

## 2017-01-05 ENCOUNTER — Other Ambulatory Visit: Payer: Self-pay

## 2017-01-05 VITALS — Ht 70.0 in | Wt 278.2 lb

## 2017-01-05 DIAGNOSIS — Z1211 Encounter for screening for malignant neoplasm of colon: Secondary | ICD-10-CM

## 2017-01-05 MED ORDER — NA SULFATE-K SULFATE-MG SULF 17.5-3.13-1.6 GM/177ML PO SOLN: 1.0000 [IU] | Freq: Once | ORAL | 0 refills | Status: AC

## 2017-01-05 NOTE — Progress Notes (Signed)
No egg or soy allergy known to patient  No issues with past sedation with any surgeries  or procedures, no intubation problems  Some vomiting after shoulder surgery No diet pills per patient No home 02 use per patient  No blood thinners per patient  Pt denies issues with constipation  No A fib or A flutter  EMMI video sent to pt's e mail

## 2017-01-13 ENCOUNTER — Other Ambulatory Visit: Payer: Self-pay | Admitting: Family Medicine

## 2017-01-13 DIAGNOSIS — I10 Essential (primary) hypertension: Secondary | ICD-10-CM

## 2017-01-13 DIAGNOSIS — E038 Other specified hypothyroidism: Secondary | ICD-10-CM

## 2017-01-18 ENCOUNTER — Ambulatory Visit (AMBULATORY_SURGERY_CENTER): Payer: BLUE CROSS/BLUE SHIELD | Admitting: Internal Medicine

## 2017-01-18 ENCOUNTER — Other Ambulatory Visit: Payer: Self-pay

## 2017-01-18 ENCOUNTER — Encounter: Payer: Self-pay | Admitting: Internal Medicine

## 2017-01-18 VITALS — BP 113/77 | HR 71 | Temp 98.0°F | Resp 17 | Ht 70.0 in | Wt 278.0 lb

## 2017-01-18 DIAGNOSIS — D123 Benign neoplasm of transverse colon: Secondary | ICD-10-CM | POA: Diagnosis not present

## 2017-01-18 DIAGNOSIS — D122 Benign neoplasm of ascending colon: Secondary | ICD-10-CM | POA: Diagnosis not present

## 2017-01-18 DIAGNOSIS — Z1212 Encounter for screening for malignant neoplasm of rectum: Secondary | ICD-10-CM

## 2017-01-18 DIAGNOSIS — Z1211 Encounter for screening for malignant neoplasm of colon: Secondary | ICD-10-CM | POA: Diagnosis present

## 2017-01-18 MED ORDER — SODIUM CHLORIDE 0.9 % IV SOLN: 500.0000 mL | INTRAVENOUS | Status: DC

## 2017-01-18 NOTE — Progress Notes (Signed)
Report to PACU, RN, vss, BBS= Clear.  

## 2017-01-18 NOTE — Progress Notes (Signed)
Pt's states no medical or surgical changes since previsit or office visit. 

## 2017-01-18 NOTE — Op Note (Signed)
[name] Patient Name: Justin Green Procedure Date: 01/18/2017 9:00 AM MRN: [id number] Endoscopist: Docia Chuck. Henrene Pastor , MD Age: 52 Referring MD:  Date of Birth: [birth year] Gender: Male Account #: [id number] Procedure:                Colonoscopy with cold snare polypectomy x 5 Indications:              Screening for colorectal malignant neoplasm Medicines:                Monitored Anesthesia Care Procedure:                Pre-Anesthesia Assessment:                           - Prior to the procedure, a History and Physical                            was performed, and patient medications and                            allergies were reviewed. The patient's tolerance of                            previous anesthesia was also reviewed. The risks                            and benefits of the procedure and the sedation                            options and risks were discussed with the patient.                            All questions were answered, and informed consent                            was obtained. Prior Anticoagulants: The patient has                            taken no previous anticoagulant or antiplatelet                            agents. ASA Grade Assessment: II - A patient with                            mild systemic disease. After reviewing the risks                            and benefits, the patient was deemed in                            satisfactory condition to undergo the procedure.                           After obtaining informed consent, the colonoscope  was passed under direct vision. Throughout the                            procedure, the patient's blood pressure, pulse, and                            oxygen saturations were monitored continuously. The                            Colonoscope was introduced through the anus and                            advanced to the the cecum, identified by    appendiceal orifice and ileocecal valve. The                            ileocecal valve, appendiceal orifice, and rectum                            were photographed. The quality of the bowel                            preparation was excellent. The colonoscopy was                            performed without difficulty. The patient tolerated                            the procedure well. The bowel preparation used was                            SUPREP. Scope In: 9:04:42 AM Scope Out: 9:23:23 AM Scope Withdrawal Time: 0 hours 16 minutes 32 seconds  Total Procedure Duration: 0 hours 18 minutes 41 seconds  Findings:                 Five sessile polyps were found in the transverse                            colon and ascending colon. The polyps were 4 to 6                            mm in size. These polyps were removed with a cold                            snare. Resection and retrieval were complete.                           A few small-mouthed diverticula were found in the                            left colon.                           Internal hemorrhoids were found  during                            retroflexion. The hemorrhoids were small.                           The exam was otherwise without abnormality on                            direct and retroflexion views. Complications:            No immediate complications. Estimated blood loss:                            None. Estimated Blood Loss:     Estimated blood loss: none. Impression:               - Five 4 to 6 mm polyps in the transverse colon and                            in the ascending colon, removed with a cold snare.                            Resected and retrieved.                           - Diverticulosis in the left colon.                           - Internal hemorrhoids.                           - The examination was otherwise normal on direct                            and retroflexion views. Recommendation:            - Repeat colonoscopy in 3 years for surveillance.                           - Patient has a contact number available for                            emergencies. The signs and symptoms of potential                            delayed complications were discussed with the                            patient. Return to normal activities tomorrow.                            Written discharge instructions were provided to the                            patient.                           -  Resume previous diet.                           - Continue present medications.                           - Await pathology results. Docia Chuck. Henrene Pastor, MD 01/18/2017 9:29:03 AM This report has been signed electronically.

## 2017-01-18 NOTE — Patient Instructions (Signed)
infromation given on polys and diverticulosis.YOU HAD AN ENDOSCOPIC PROCEDURE TODAY AT [facility]:   Refer to the procedure report that was given to you for any specific questions about what was found during the examination.  If the procedure report does not answer your questions, please call your gastroenterologist to clarify.  If you requested that your care partner not be given the details of your procedure findings, then the procedure report has been included in a sealed envelope for you to review at your convenience later.  YOU SHOULD EXPECT: Some feelings of bloating in the abdomen. Passage of more gas than usual.  Walking can help get rid of the air that was put into your GI tract during the procedure and reduce the bloating. If you had a lower endoscopy (such as a colonoscopy or flexible sigmoidoscopy) you may notice spotting of blood in your stool or on the toilet paper. If you underwent a bowel prep for your procedure, you may not have a normal bowel movement for a few days.  Please Note:  You might notice some irritation and congestion in your nose or some drainage.  This is from the oxygen used during your procedure.  There is no need for concern and it should clear up in a day or so.  SYMPTOMS TO REPORT IMMEDIATELY:   Following lower endoscopy (colonoscopy or flexible sigmoidoscopy):  Excessive amounts of blood in the stool  Significant tenderness or worsening of abdominal pains  Swelling of the abdomen that is new, acute  Fever of 100F or higher For urgent or emergent issues, a gastroenterologist can be reached at any hour by calling [phone].   DIET:  We do recommend a small meal at first, but then you may proceed to your regular diet.  Drink plenty of fluids but you should avoid alcoholic beverages for 24 hours.  ACTIVITY:  You should plan to take it easy for the rest of today and you should NOT DRIVE or use heavy machinery until tomorrow (because of the  sedation medicines used during the test).    FOLLOW UP: Our staff will call the number listed on your records the next business day following your procedure to check on you and address any questions or concerns that you may have regarding the information given to you following your procedure. If we do not reach you, we will leave a message.  However, if you are feeling well and you are not experiencing any problems, there is no need to return our call.  We will assume that you have returned to your regular daily activities without incident.  If any biopsies were taken you will be contacted by phone or by letter within the next 1-3 weeks.  Please call us at [phone] if you have not heard about the biopsies in 3 weeks.    SIGNATURES/CONFIDENTIALITY: You and/or your care partner have signed paperwork which will be entered into your electronic medical record.  These signatures attest to the fact that that the information above on your After Visit Summary has been reviewed and is understood.  Full responsibility of the confidentiality of this discharge information lies with you and/or your care-partner.

## 2017-01-18 NOTE — Progress Notes (Signed)
Called to room to assist during endoscopic procedure.  Patient ID and intended procedure confirmed with present staff. Received instructions for my participation in the procedure from the performing physician.  

## 2017-01-19 ENCOUNTER — Telehealth: Payer: Self-pay | Admitting: *Deleted

## 2017-01-19 NOTE — Telephone Encounter (Signed)
  Follow up Call-  Call back number 01/18/2017  Post procedure Call Back phone  # [phone]  Permission to leave phone message Yes  Some recent data might be hidden     Patient questions:  Do you have a fever, pain , or abdominal swelling? No. Pain Score  0 *  Have you tolerated food without any problems? Yes.    Have you been able to return to your normal activities? Yes.    Do you have any questions about your discharge instructions: Diet   No. Medications  No. Follow up visit  No.  Do you have questions or concerns about your Care? No.  Actions: * If pain score is 4 or above: No action needed, pain <4.

## 2017-01-23 ENCOUNTER — Encounter: Payer: Self-pay | Admitting: Internal Medicine

## 2017-01-29 ENCOUNTER — Telehealth: Payer: Self-pay | Admitting: Family Medicine

## 2017-01-29 MED ORDER — EDARBYCLOR 40-25 MG PO TABS: 1.0000 | ORAL_TABLET | Freq: Every day | ORAL | 1 refills | Status: DC

## 2017-01-29 NOTE — Telephone Encounter (Signed)
Pt called and is requesting a refill on his edarbyclor pt uses [facility], [place]. Pt can be reached at [phone]

## 2017-01-29 NOTE — Telephone Encounter (Signed)
Done

## 2017-02-26 ENCOUNTER — Encounter: Payer: Self-pay | Admitting: Family Medicine

## 2017-02-26 ENCOUNTER — Ambulatory Visit: Payer: BLUE CROSS/BLUE SHIELD | Admitting: Family Medicine

## 2017-02-26 VITALS — BP 118/74 | HR 76 | Wt 274.8 lb

## 2017-02-26 DIAGNOSIS — E038 Other specified hypothyroidism: Secondary | ICD-10-CM | POA: Diagnosis not present

## 2017-02-26 DIAGNOSIS — R7303 Prediabetes: Secondary | ICD-10-CM

## 2017-02-26 DIAGNOSIS — Z860100 Personal history of colon polyps, unspecified: Secondary | ICD-10-CM

## 2017-02-26 LAB — POCT GLYCOSYLATED HEMOGLOBIN (HGB A1C): Hemoglobin A1C: 6.5

## 2017-02-26 NOTE — Progress Notes (Signed)
   Subjective:    Patient ID: Justin Green, male    DOB: 06/09/1965, 52 y.o.   MRN: [id number]  HPI He is here for a recheck on his thyroid.  He is now on a higher dose and having no difficulty with that.  Also his recent blood work did show slightly elevated blood sugar and he does have a history of glucose intolerance.  He was also recently seen and colonoscopy did show colonic polyps.  He is scheduled for follow-up in 3 years.   Review of Systems     Objective:   Physical Exam Alert and in no distress.  Hemoglobin A1c is now at 6.5.       Assessment & Plan:  Prediabetes - Plan: HgB A1c  Other specified hypothyroidism - Plan: TSH  Hx of colonic polyps  I discussed with him that technically he is at the threshold for diabetes and again discussed diet and exercise with him.  I explained that the A1c of 6.5 is an arbitrary point on the spectrum and that he can reverse this with diet and exercise; also discussed the [employer], [place] and possible referral to a nutritionist.  At this time he would like to wait on it and work further on diet and exercise.  He is to return here in 3 months for a recheck.

## 2017-02-27 LAB — TSH: TSH: 1.59 u[IU]/mL (ref 0.450–4.500)

## 2017-02-27 MED ORDER — LEVOTHYROXINE SODIUM 150 MCG PO TABS: 150.0000 ug | ORAL_TABLET | Freq: Every day | ORAL | 3 refills | Status: DC

## 2017-02-27 NOTE — Addendum Note (Signed)
Addended by: Denita Lung on: 02/27/2017 09:26 AM   Modules accepted: Orders

## 2017-03-26 ENCOUNTER — Other Ambulatory Visit: Payer: Self-pay | Admitting: Family Medicine

## 2017-03-26 DIAGNOSIS — E038 Other specified hypothyroidism: Secondary | ICD-10-CM

## 2017-03-29 ENCOUNTER — Other Ambulatory Visit: Payer: Self-pay

## 2017-03-29 ENCOUNTER — Telehealth: Payer: Self-pay | Admitting: Family Medicine

## 2017-03-29 NOTE — Telephone Encounter (Signed)
Pt says that Levothyroxine refill should have gone to [facility]. He would like for all of the [facility] pharmacy's to be removed from his file so there is not any confusion on refills in the future because this keeps happening.

## 2017-03-29 NOTE — Telephone Encounter (Signed)
Pt called back again stating [facility] denied the drug and he was trying to figure out as to why.  Please f/u with pt

## 2017-03-29 NOTE — Telephone Encounter (Signed)
Called pt back to let him know that pharmacy was removed. Kh

## 2017-03-30 NOTE — Telephone Encounter (Signed)
Called pt to let him know that med was sent in and his insurance only cover 30 day supply. LVM [place] [facility] 03-30-17

## 2017-06-18 ENCOUNTER — Ambulatory Visit: Payer: BLUE CROSS/BLUE SHIELD | Admitting: Family Medicine

## 2017-06-21 ENCOUNTER — Encounter: Payer: Self-pay | Admitting: Family Medicine

## 2017-06-21 ENCOUNTER — Ambulatory Visit: Payer: BLUE CROSS/BLUE SHIELD | Admitting: Family Medicine

## 2017-06-21 VITALS — BP 116/74 | HR 71 | Temp 98.0°F | Ht 70.0 in | Wt 263.4 lb

## 2017-06-21 DIAGNOSIS — E669 Obesity, unspecified: Secondary | ICD-10-CM | POA: Diagnosis not present

## 2017-06-21 DIAGNOSIS — R7303 Prediabetes: Secondary | ICD-10-CM

## 2017-06-21 LAB — POCT GLYCOSYLATED HEMOGLOBIN (HGB A1C): Hemoglobin A1C: 6.3 % — AB (ref 4.0–5.6)

## 2017-06-21 NOTE — Progress Notes (Signed)
  Subjective:     Patient ID: Justin Green, male   DOB: 22-Nov-1965, 52 y.o.   MRN: [id number]  HPI He presents with a 4 month follow up of his glucose intolerance. States he has lost 11lbs. Has been more mindful of food choices. Cut back on breads/pasta/chips. Exercises 3x/week, walks about 1 mile. BP stable today. States he is a lot less stressed since returned 2 days ago from work-related activites. Continues on synthroid. Non-smoker. Occasionally drinks alcohol and chews tobacco.  Review of Systems     Objective:   Physical Exam .  Alert and in no distress otherwise not examined Hemoglobin A1c is 6.3    Assessment:    Prediabetes  Obesity (BMI 30-39.9)      Plan:     I encouraged him to continue with his diet and exercise and work on getting his waist size down to 36.  Recheck here in 6 months   Patient was seen in conjunction with [facility]

## 2017-08-23 ENCOUNTER — Other Ambulatory Visit: Payer: Self-pay | Admitting: Family Medicine

## 2017-10-15 ENCOUNTER — Telehealth: Payer: Self-pay

## 2017-10-15 NOTE — Telephone Encounter (Signed)
Pt called to ask if he could have a script refill on his valtrex generic. Pt says it has not been fill ed in the past due to suc a large supply he had on hand but now is out. He would like it sent into [facility]. Please advise [facility]

## 2017-10-16 MED ORDER — VALACYCLOVIR HCL 1 G PO TABS: 1000.0000 mg | ORAL_TABLET | Freq: Two times a day (BID) | ORAL | 0 refills | Status: DC

## 2017-11-27 ENCOUNTER — Ambulatory Visit: Payer: BLUE CROSS/BLUE SHIELD | Admitting: Family Medicine

## 2017-11-27 ENCOUNTER — Encounter: Payer: Self-pay | Admitting: Family Medicine

## 2017-11-27 VITALS — BP 118/78 | HR 68 | Temp 97.9°F | Ht 70.0 in | Wt 268.8 lb

## 2017-11-27 DIAGNOSIS — Z23 Encounter for immunization: Secondary | ICD-10-CM | POA: Diagnosis not present

## 2017-11-27 DIAGNOSIS — E669 Obesity, unspecified: Secondary | ICD-10-CM

## 2017-11-27 DIAGNOSIS — R7303 Prediabetes: Secondary | ICD-10-CM | POA: Diagnosis not present

## 2017-11-27 LAB — POCT GLYCOSYLATED HEMOGLOBIN (HGB A1C): HEMOGLOBIN A1C: 6.2 % — AB (ref 4.0–5.6)

## 2017-11-27 NOTE — Progress Notes (Signed)
   Subjective:    Patient ID: Justin Green, male    DOB: [birth year], 52 y.o.   MRN: [id number]  HPI He is here for recheck on his hemoglobin A1c.  He admits to not making any changes in regard to diet and exercise.  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   Review of Systems     Objective:   Physical Exam Alert and in no distress.  Weight is unchanged.  Hemoglobin A1c is 6.2.      Assessment & Plan:  Prediabetes  Need for influenza vaccination  Obesity (BMI 30-39.9) I again discussed the need for him to make further diet and exercise changes.  Specifically mentioned preplanning his meals and taking them with him when he goes to the very small [place] in order to have more control over his eating habits.  He recognizes the need for this but the look on his face said that he probably will not do it.

## 2018-01-28 ENCOUNTER — Other Ambulatory Visit: Payer: Self-pay | Admitting: Family Medicine

## 2018-02-12 DIAGNOSIS — J069 Acute upper respiratory infection, unspecified: Secondary | ICD-10-CM | POA: Diagnosis not present

## 2018-02-12 DIAGNOSIS — I10 Essential (primary) hypertension: Secondary | ICD-10-CM | POA: Diagnosis not present

## 2018-02-12 DIAGNOSIS — R05 Cough: Secondary | ICD-10-CM | POA: Diagnosis not present

## 2018-03-29 ENCOUNTER — Other Ambulatory Visit: Payer: Self-pay | Admitting: Family Medicine

## 2018-03-29 NOTE — Telephone Encounter (Signed)
Is this ok to refill?  

## 2018-05-08 ENCOUNTER — Telehealth: Payer: Self-pay

## 2018-05-08 NOTE — Telephone Encounter (Signed)
LVM for pt to see if he would like to come in for CPE. [place]

## 2018-05-13 ENCOUNTER — Ambulatory Visit: Payer: BLUE CROSS/BLUE SHIELD | Admitting: Family Medicine

## 2018-05-13 ENCOUNTER — Other Ambulatory Visit: Payer: Self-pay

## 2018-05-13 ENCOUNTER — Encounter: Payer: Self-pay | Admitting: Family Medicine

## 2018-05-13 VITALS — BP 110/76 | HR 60 | Temp 98.0°F | Ht 69.0 in | Wt 278.4 lb

## 2018-05-13 DIAGNOSIS — E119 Type 2 diabetes mellitus without complications: Secondary | ICD-10-CM | POA: Diagnosis not present

## 2018-05-13 DIAGNOSIS — E1169 Type 2 diabetes mellitus with other specified complication: Secondary | ICD-10-CM

## 2018-05-13 DIAGNOSIS — E118 Type 2 diabetes mellitus with unspecified complications: Secondary | ICD-10-CM | POA: Insufficient documentation

## 2018-05-13 DIAGNOSIS — Z0000 Encounter for general adult medical examination without abnormal findings: Secondary | ICD-10-CM | POA: Diagnosis not present

## 2018-05-13 DIAGNOSIS — E038 Other specified hypothyroidism: Secondary | ICD-10-CM

## 2018-05-13 DIAGNOSIS — Z86010 Personal history of colonic polyps: Secondary | ICD-10-CM

## 2018-05-13 DIAGNOSIS — E669 Obesity, unspecified: Secondary | ICD-10-CM

## 2018-05-13 DIAGNOSIS — E785 Hyperlipidemia, unspecified: Secondary | ICD-10-CM | POA: Diagnosis not present

## 2018-05-13 DIAGNOSIS — K219 Gastro-esophageal reflux disease without esophagitis: Secondary | ICD-10-CM

## 2018-05-13 DIAGNOSIS — N529 Male erectile dysfunction, unspecified: Secondary | ICD-10-CM

## 2018-05-13 DIAGNOSIS — G4730 Sleep apnea, unspecified: Secondary | ICD-10-CM

## 2018-05-13 DIAGNOSIS — I10 Essential (primary) hypertension: Secondary | ICD-10-CM

## 2018-05-13 LAB — POCT URINALYSIS DIP (PROADVANTAGE DEVICE)
Bilirubin, UA: NEGATIVE
Blood, UA: NEGATIVE
Glucose, UA: NEGATIVE mg/dL
Ketones, POC UA: NEGATIVE mg/dL
Leukocytes, UA: NEGATIVE
Nitrite, UA: NEGATIVE
Protein Ur, POC: NEGATIVE mg/dL
Specific Gravity, Urine: 1.025
Urobilinogen, Ur: 3.5
pH, UA: 5.5 (ref 5.0–8.0)

## 2018-05-13 LAB — POCT GLYCOSYLATED HEMOGLOBIN (HGB A1C): Hemoglobin A1C: 6.5 % — AB (ref 4.0–5.6)

## 2018-05-13 MED ORDER — SILDENAFIL CITRATE 100 MG PO TABS: 100.0000 mg | ORAL_TABLET | ORAL | 5 refills | Status: DC | PRN

## 2018-05-13 MED ORDER — LEVOTHYROXINE SODIUM 150 MCG PO TABS: 150.0000 ug | ORAL_TABLET | Freq: Every day | ORAL | 11 refills | Status: DC

## 2018-05-13 MED ORDER — EDARBYCLOR 40-25 MG PO TABS: 1.0000 | ORAL_TABLET | Freq: Every day | ORAL | 11 refills | Status: DC

## 2018-05-13 NOTE — Progress Notes (Addendum)
   Subjective:    Patient ID: Justin Green, male    DOB: 04-05-1965, 53 y.o.   MRN: [id number]  HPI He is here for complete examination.  He does have an underlying history of hypertension and continues on Edarbychlor.  He is also taking Synthroid and having no difficulty with that.  He would like a refill on his sildenafil as he does have intermittent difficulty with ED.  He has not been working his normal schedule due to the [place] and this has interfered with his normal exercise pattern.  He has gained approximately 10 pounds since last being seen.  Does have an underlying history of glucose intolerance.  He also has a history of OSA but presently is not on CPAP.  Does have a history of colonic polyps and is scheduled for repeat in about 2 years Family and social history as well as health maintenance and immunizations was reviewed.   Review of Systems  All other systems reviewed and are negative.      Objective:   Physical Exam Alert and in no distress. Tympanic membranes and canals are normal. Pharyngeal area is normal. Neck is supple without adenopathy or thyromegaly. Cardiac exam shows a regular sinus rhythm without murmurs or gallops. Lungs are clear to auscultation.  Abdominal exam shows no masses or tenderness with normal bowel sounds. Hemoglobin A1c is 6.5       Assessment & Plan:  Routine general medical examination at health care facility - Plan: POCT Urinalysis DIP (Proadvantage Device), CBC with Differential/Platelet, Comprehensive metabolic panel, Lipid panel  Obesity (BMI 30-39.9) - Plan: POCT glycosylated hemoglobin (Hb A1C)  Other specified hypothyroidism - Plan: TSH, levothyroxine (SYNTHROID) 150 MCG tablet  Hx of colonic polyps  New onset type 2 diabetes mellitus ([place]) - Plan: POCT glycosylated hemoglobin (Hb A1C), CANCELED: HgB A1c  Gastroesophageal reflux disease without esophagitis  Essential hypertension - Plan: EDARBYCLOR 40-25 MG TABS  Sleep apnea,  unspecified type  Erectile dysfunction, unspecified erectile dysfunction type - Plan: sildenafil (VIAGRA) 100 MG tablet I explained the fact that now he has diabetes and we need to start having him check his blood sugars more regularly either before a meal or preferably 2 hours after meals.  Discussed the need for exercise on a regular basis.  We will eventually refer to diabetes management but because of COVID no need to do it at the present time.  Did recommend several websites to go to concerning this.  Follow-up with him in several months.  5/12 the LDL was in the 90 range.  I will therefore put him on Lipitor.

## 2018-05-13 NOTE — Patient Instructions (Signed)
20 minutes of something physical daily or 150 minutes a week of something physical.  Cut back on the carbs and the easiest way to remember it is white food.

## 2018-05-14 ENCOUNTER — Other Ambulatory Visit: Payer: Self-pay

## 2018-05-14 ENCOUNTER — Encounter: Payer: Self-pay | Admitting: Family Medicine

## 2018-05-14 DIAGNOSIS — E119 Type 2 diabetes mellitus without complications: Secondary | ICD-10-CM

## 2018-05-14 LAB — COMPREHENSIVE METABOLIC PANEL
ALT: 29 IU/L (ref 0–44)
AST: 18 IU/L (ref 0–40)
Albumin/Globulin Ratio: 1.8 (ref 1.2–2.2)
Albumin: 4.5 g/dL (ref 3.8–4.9)
Alkaline Phosphatase: 89 IU/L (ref 39–117)
BUN/Creatinine Ratio: 21 — ABNORMAL HIGH (ref 9–20)
BUN: 25 mg/dL — ABNORMAL HIGH (ref 6–24)
Bilirubin Total: <0.2 mg/dL (ref 0.0–1.2)
CO2: 25 mmol/L (ref 20–29)
Calcium: 9.7 mg/dL (ref 8.7–10.2)
Chloride: 100 mmol/L (ref 96–106)
Creatinine, Ser: 1.21 mg/dL (ref 0.76–1.27)
GFR calc Af Amer: 79 mL/min/{1.73_m2} (ref >59)
GFR calc non Af Amer: 68 mL/min/{1.73_m2} (ref >59)
Globulin, Total: 2.5 g/dL (ref 1.5–4.5)
Glucose: 122 mg/dL — ABNORMAL HIGH (ref 65–99)
Potassium: 4.5 mmol/L (ref 3.5–5.2)
Sodium: 138 mmol/L (ref 134–144)
Total Protein: 7 g/dL (ref 6.0–8.5)

## 2018-05-14 LAB — CBC WITH DIFFERENTIAL/PLATELET
Basophils Absolute: 0.1 10*3/uL (ref 0.0–0.2)
Basos: 1 %
EOS (ABSOLUTE): 0.2 10*3/uL (ref 0.0–0.4)
Eos: 3 %
Hematocrit: 43.1 % (ref 37.5–51.0)
Hemoglobin: 14.9 g/dL (ref 13.0–17.7)
Immature Grans (Abs): 0 10*3/uL (ref 0.0–0.1)
Immature Granulocytes: 1 %
Lymphocytes Absolute: 2.3 10*3/uL (ref 0.7–3.1)
Lymphs: 28 %
MCH: 29.3 pg (ref 26.6–33.0)
MCHC: 34.6 g/dL (ref 31.5–35.7)
MCV: 85 fL (ref 79–97)
Monocytes Absolute: 0.8 10*3/uL (ref 0.1–0.9)
Monocytes: 10 %
Neutrophils Absolute: 4.8 10*3/uL (ref 1.4–7.0)
Neutrophils: 57 %
Platelets: 208 10*3/uL (ref 150–450)
RBC: 5.09 x10E6/uL (ref 4.14–5.80)
RDW: 12.2 % (ref 11.6–15.4)
WBC: 8.2 10*3/uL (ref 3.4–10.8)

## 2018-05-14 LAB — LIPID PANEL
Chol/HDL Ratio: 4.1 ratio (ref 0.0–5.0)
Cholesterol, Total: 152 mg/dL (ref 100–199)
HDL: 37 mg/dL — ABNORMAL LOW (ref >39)
LDL Calculated: 93 mg/dL (ref 0–99)
Triglycerides: 108 mg/dL (ref 0–149)
VLDL Cholesterol Cal: 22 mg/dL (ref 5–40)

## 2018-05-14 LAB — TSH: TSH: 2.04 u[IU]/mL (ref 0.450–4.500)

## 2018-05-14 MED ORDER — ATORVASTATIN CALCIUM 20 MG PO TABS: 20.0000 mg | ORAL_TABLET | Freq: Every day | ORAL | 3 refills | Status: DC

## 2018-05-14 MED ORDER — ONETOUCH DELICA LANCETS 33G MISC: 1.0000 | Freq: Once | 3 refills | Status: AC

## 2018-05-14 MED ORDER — GLUCOSE BLOOD VI STRP: ORAL_STRIP | 12 refills | Status: DC

## 2018-05-14 NOTE — Addendum Note (Signed)
Addended by: Denita Lung on: 05/14/2018 08:31 AM   Modules accepted: Orders

## 2018-08-08 ENCOUNTER — Telehealth: Payer: Self-pay

## 2018-08-08 NOTE — Telephone Encounter (Signed)
LVM for pt to call complete a covid screening questions. [place]

## 2018-08-13 ENCOUNTER — Encounter: Payer: Self-pay | Admitting: Family Medicine

## 2018-08-13 ENCOUNTER — Other Ambulatory Visit: Payer: Self-pay

## 2018-08-13 ENCOUNTER — Ambulatory Visit: Payer: BC Managed Care – PPO | Admitting: Family Medicine

## 2018-08-13 VITALS — BP 118/78 | HR 78 | Temp 97.9°F | Wt 270.6 lb

## 2018-08-13 DIAGNOSIS — E119 Type 2 diabetes mellitus without complications: Secondary | ICD-10-CM

## 2018-08-13 LAB — POCT GLYCOSYLATED HEMOGLOBIN (HGB A1C): Hemoglobin A1C: 5.8 % — AB (ref 4.0–5.6)

## 2018-08-13 MED ORDER — ONETOUCH VERIO VI STRP: ORAL_STRIP | 12 refills | Status: DC

## 2018-08-13 NOTE — Progress Notes (Signed)
  Subjective:    Patient ID: Justin Green, male    DOB: [birth year], 53 y.o.   MRN: [id number]  Justin Green is a 53 y.o. male who presents for follow-up of Type 2 diabetes mellitus.  Patient is checking home blood sugars.   Home blood sugar records: meter records How often is blood sugars being checked: fasting and 2 hours post meal 89- 190 Current symptoms/problems include none at this time. Daily foot checks: yes   Any foot concerns: none Last eye exam: four month ago Exercise: walking QOD He has made better dietary changes and has noticed some weight loss.  He continues on medications listed in the chart.  His work schedule has changed a lot due to the [place]. The following portions of the patient's history were reviewed and updated as appropriate: allergies, current medications, past medical history, past social history and problem list.  ROS as in subjective above.     Objective:    Physical Exam Alert and in no distress otherwise not examined.  Hemoglobin A1c is 5.8 Lab Review Diabetic Labs Latest Ref Rng & Units 05/13/2018 11/27/2017 06/21/2017 02/26/2017 12/28/2016  HbA1c 4.0 - 5.6 % 6.5(A) 6.2(A) 6.3(A) 6.5 -  Chol 100 - 199 mg/dL 152 - - - 209(H)  HDL >39 mg/dL 37(L) - - - 45  Calc LDL 0 - 99 mg/dL 93 - - - 136(H)  Triglycerides 0 - 149 mg/dL 108 - - - 153(H)  Creatinine 0.76 - 1.27 mg/dL 1.21 - - - 1.21   BP/Weight 05/13/2018 11/27/2017 06/21/2017 02/26/2017 2/77/4128  Systolic BP 786 767 209 470 962  Diastolic BP 76 78 74 74 77  Wt. (Lbs) 278.4 268.8 263.4 274.8 278  BMI 41.11 38.57 37.79 39.43 39.89    Justin Green  reports that he has never smoked. His smokeless tobacco use includes chew. He reports current alcohol use. He reports that he does not use drugs.     Assessment & Plan:    Encounter Diagnosis  Name Primary?  . New onset type 2 diabetes mellitus ([place]) Yes     1. Rx changes: none 2. Education: Reviewed 'ABCs' of diabetes management (respective  goals in parentheses):  A1C (<7), blood pressure (<130/80), and cholesterol (LDL <100). 3. Compliance at present is estimated to be excellent. Efforts to improve compliance (if necessary) will be directed at increased exercise. 4. Follow up: 4 months I congratulated him on the progress that he has made with his diet and exercise.  Encouraged him to continue with his.  Recommend he set a goal waist size rather than actual weight.  He is scheduled for a complete exam in several months.

## 2018-10-18 ENCOUNTER — Encounter: Payer: Self-pay | Admitting: Internal Medicine

## 2018-11-26 ENCOUNTER — Encounter: Payer: Self-pay | Admitting: Family Medicine

## 2018-12-06 DIAGNOSIS — Z20828 Contact with and (suspected) exposure to other viral communicable diseases: Secondary | ICD-10-CM | POA: Diagnosis not present

## 2018-12-30 ENCOUNTER — Encounter: Payer: Self-pay | Admitting: Family Medicine

## 2019-01-03 DIAGNOSIS — Z20822 Contact with and (suspected) exposure to covid-19: Secondary | ICD-10-CM | POA: Diagnosis not present

## 2019-01-16 ENCOUNTER — Other Ambulatory Visit: Payer: Self-pay

## 2019-01-16 ENCOUNTER — Encounter: Payer: Self-pay | Admitting: Family Medicine

## 2019-01-16 ENCOUNTER — Ambulatory Visit (INDEPENDENT_AMBULATORY_CARE_PROVIDER_SITE_OTHER): Payer: BC Managed Care – PPO | Admitting: Family Medicine

## 2019-01-16 VITALS — BP 110/72 | HR 84 | Temp 97.7°F | Wt 274.4 lb

## 2019-01-16 DIAGNOSIS — E119 Type 2 diabetes mellitus without complications: Secondary | ICD-10-CM

## 2019-01-16 DIAGNOSIS — I10 Essential (primary) hypertension: Secondary | ICD-10-CM

## 2019-01-16 DIAGNOSIS — K219 Gastro-esophageal reflux disease without esophagitis: Secondary | ICD-10-CM | POA: Diagnosis not present

## 2019-01-16 DIAGNOSIS — Z23 Encounter for immunization: Secondary | ICD-10-CM | POA: Diagnosis not present

## 2019-01-16 DIAGNOSIS — G4730 Sleep apnea, unspecified: Secondary | ICD-10-CM

## 2019-01-16 DIAGNOSIS — E038 Other specified hypothyroidism: Secondary | ICD-10-CM | POA: Diagnosis not present

## 2019-01-16 DIAGNOSIS — Z86010 Personal history of colonic polyps: Secondary | ICD-10-CM

## 2019-01-16 LAB — POCT GLYCOSYLATED HEMOGLOBIN (HGB A1C): Hemoglobin A1C: 6.6 % — AB (ref 4.0–5.6)

## 2019-01-16 NOTE — Progress Notes (Signed)
   Subjective:    Patient ID: Justin Green, male    DOB: [birth year], 54 y.o.   MRN: [id number]  HPI He is here for medication check.  He has made some minor changes in diet and exercise but does also gone through the holiday season with a couple of trips.  He does have underlying OSA but has no need for CPAP.  He continues on atorvastatin without difficulty of aches or pains.  Continues on his blood pressure medications.  He is also taking Synthroid and notes no problems with that.  He does use sildenafil on an as-needed basis.  His reflux is under good control.  He does have colonic polyps and is scheduled for routine follow-up there.   Review of Systems     Objective:   Physical Exam Alert and in no distress.  Hemoglobin A1c is 6.6.       Assessment & Plan:  Type 2 diabetes mellitus without complication, without long-term current use of insulin ([place]) - Plan: HgB A1c  Gastroesophageal reflux disease without esophagitis  Essential hypertension  Other specified hypothyroidism  Hx of colonic polyps  Sleep apnea, unspecified type  Need for vaccination against Streptococcus pneumoniae - Plan: Pneumococcal conjugate vaccine 13-valent  Need for influenza vaccination - Plan: Flu Vaccine QUAD 6+ mos PF IM (Fluarix Quad PF) Discussed the fact that he needs to make more changes in diet and exercise.  Do not think the medication at this point is needed other than what he is already on.  I will see him in about 4 months.  He will continue on his present medications.  I will do blood work and foot exam.  He plans to get an eye exam.

## 2019-05-14 ENCOUNTER — Encounter: Payer: Self-pay | Admitting: Family Medicine

## 2019-05-19 ENCOUNTER — Encounter: Payer: BC Managed Care – PPO | Admitting: Family Medicine

## 2019-05-28 ENCOUNTER — Other Ambulatory Visit: Payer: Self-pay | Admitting: Family Medicine

## 2019-05-28 DIAGNOSIS — E1169 Type 2 diabetes mellitus with other specified complication: Secondary | ICD-10-CM

## 2019-05-28 DIAGNOSIS — E785 Hyperlipidemia, unspecified: Secondary | ICD-10-CM

## 2019-05-28 NOTE — Telephone Encounter (Signed)
Received a fax from [facility] store for a refill request on the pts. Edarbyclor pt. Last apt was 01/16/19.

## 2019-05-29 ENCOUNTER — Other Ambulatory Visit: Payer: Self-pay

## 2019-05-29 DIAGNOSIS — I10 Essential (primary) hypertension: Secondary | ICD-10-CM

## 2019-05-29 MED ORDER — EDARBYCLOR 40-25 MG PO TABS: 1.0000 | ORAL_TABLET | Freq: Every day | ORAL | 11 refills | Status: DC

## 2019-05-29 NOTE — Telephone Encounter (Signed)
Other was sent in as well [facility]

## 2019-05-31 ENCOUNTER — Other Ambulatory Visit: Payer: Self-pay | Admitting: Family Medicine

## 2019-05-31 DIAGNOSIS — E038 Other specified hypothyroidism: Secondary | ICD-10-CM

## 2019-05-31 DIAGNOSIS — I10 Essential (primary) hypertension: Secondary | ICD-10-CM

## 2019-06-09 ENCOUNTER — Ambulatory Visit: Payer: BC Managed Care – PPO | Admitting: Family Medicine

## 2019-06-09 ENCOUNTER — Other Ambulatory Visit: Payer: Self-pay

## 2019-06-09 ENCOUNTER — Encounter: Payer: Self-pay | Admitting: Family Medicine

## 2019-06-09 VITALS — BP 108/68 | HR 72 | Temp 97.7°F | Ht 69.5 in | Wt 263.8 lb

## 2019-06-09 DIAGNOSIS — N529 Male erectile dysfunction, unspecified: Secondary | ICD-10-CM

## 2019-06-09 DIAGNOSIS — K219 Gastro-esophageal reflux disease without esophagitis: Secondary | ICD-10-CM | POA: Diagnosis not present

## 2019-06-09 DIAGNOSIS — E038 Other specified hypothyroidism: Secondary | ICD-10-CM | POA: Diagnosis not present

## 2019-06-09 DIAGNOSIS — E785 Hyperlipidemia, unspecified: Secondary | ICD-10-CM

## 2019-06-09 DIAGNOSIS — G4730 Sleep apnea, unspecified: Secondary | ICD-10-CM

## 2019-06-09 DIAGNOSIS — Z86010 Personal history of colonic polyps: Secondary | ICD-10-CM

## 2019-06-09 DIAGNOSIS — E118 Type 2 diabetes mellitus with unspecified complications: Secondary | ICD-10-CM

## 2019-06-09 DIAGNOSIS — Z0000 Encounter for general adult medical examination without abnormal findings: Secondary | ICD-10-CM

## 2019-06-09 DIAGNOSIS — E1169 Type 2 diabetes mellitus with other specified complication: Secondary | ICD-10-CM

## 2019-06-09 DIAGNOSIS — I10 Essential (primary) hypertension: Secondary | ICD-10-CM

## 2019-06-09 LAB — POCT GLYCOSYLATED HEMOGLOBIN (HGB A1C): Hemoglobin A1C: 6.8 % — AB (ref 4.0–5.6)

## 2019-06-09 MED ORDER — EDARBYCLOR 40-25 MG PO TABS: 1.0000 | ORAL_TABLET | Freq: Every day | ORAL | 11 refills | Status: DC

## 2019-06-09 MED ORDER — SILDENAFIL CITRATE 100 MG PO TABS: 100.0000 mg | ORAL_TABLET | ORAL | 5 refills | Status: DC | PRN

## 2019-06-09 MED ORDER — ATORVASTATIN CALCIUM 20 MG PO TABS: 20.0000 mg | ORAL_TABLET | Freq: Every day | ORAL | 3 refills | Status: DC

## 2019-06-09 MED ORDER — LEVOTHYROXINE SODIUM 150 MCG PO TABS: 150.0000 ug | ORAL_TABLET | Freq: Every day | ORAL | 3 refills | Status: DC

## 2019-06-09 NOTE — Progress Notes (Signed)
   Subjective:    Patient ID: Justin Green, male    DOB: [birth year], 54 y.o.   MRN: [id number]  HPI He is here for complete examination.  He has made diet and exercise changes and has lost roughly 15 pounds.  He does have a previous hemoglobin A1c of 6.6.  He continues on his Edarbychlor and is having no difficulty with that.  He is also taking Synthroid and having no aches or pains skin or hair changes.  Takes Lipitor and has not had any concerns over muscle aches and pains.  He takes Prilosec on an as-needed basis.  He would like a refill on his sildenafil.  He does have a history of colonic polyps and does need follow-up on that.  He also has OSA and presently is not on CPAP.  Family and social history as well as health maintenance and immunizations was reviewed.  So far he has not gotten the Covid vaccine and prefers to wait.   Review of Systems  All other systems reviewed and are negative.      Objective:   Physical Exam Alert and in no distress. Tympanic membranes and canals are normal. Pharyngeal area is normal. Neck is supple without adenopathy or thyromegaly. Cardiac exam shows a regular sinus rhythm without murmurs or gallops. Lungs are clear to auscultation.  Abdominal exam shows no masses or tenderness with normal bowel sounds. Hemoglobin A1c is 6.8     Assessment & Plan:  Routine general medical examination at a health care facility - Plan: CBC with Differential/Platelet, Comprehensive metabolic panel, Lipid panel  Gastroesophageal reflux disease without esophagitis  Sleep apnea, unspecified type  Hx of colonic polyps  Other specified hypothyroidism - Plan: TSH, levothyroxine (SYNTHROID) 150 MCG tablet  Erectile dysfunction, unspecified erectile dysfunction type - Plan: sildenafil (VIAGRA) 100 MG tablet  Controlled type 2 diabetes mellitus with complication, without long-term current use of insulin (HCC) - Plan: CBC with Differential/Platelet, Comprehensive metabolic  panel, Lipid panel, POCT glycosylated hemoglobin (Hb A1C)  Hyperlipidemia associated with type 2 diabetes mellitus (HCC) - Plan: atorvastatin (LIPITOR) 20 MG tablet  Essential hypertension - Plan: EDARBYCLOR 40-25 MG TABS He was quite upset over the fact that his A1c went up.  I tried to explain to him that in spite of his good behavior, it sometimes takes more weight loss and even that might not necessarily have the effect that he would like.  Encouraged him to continue with diet and exercise.  Recheck here in 4 months.

## 2019-06-10 LAB — CBC WITH DIFFERENTIAL/PLATELET
Basophils Absolute: 0.1 10*3/uL (ref 0.0–0.2)
Basos: 1 %
EOS (ABSOLUTE): 0.2 10*3/uL (ref 0.0–0.4)
Eos: 2 %
Hematocrit: 47.9 % (ref 37.5–51.0)
Hemoglobin: 15.8 g/dL (ref 13.0–17.7)
Immature Grans (Abs): 0 10*3/uL (ref 0.0–0.1)
Immature Granulocytes: 0 %
Lymphocytes Absolute: 2.4 10*3/uL (ref 0.7–3.1)
Lymphs: 24 %
MCH: 29.5 pg (ref 26.6–33.0)
MCHC: 33 g/dL (ref 31.5–35.7)
MCV: 89 fL (ref 79–97)
Monocytes Absolute: 0.7 10*3/uL (ref 0.1–0.9)
Monocytes: 7 %
Neutrophils Absolute: 6.5 10*3/uL (ref 1.4–7.0)
Neutrophils: 66 %
Platelets: 213 10*3/uL (ref 150–450)
RBC: 5.36 x10E6/uL (ref 4.14–5.80)
RDW: 12.2 % (ref 11.6–15.4)
WBC: 9.8 10*3/uL (ref 3.4–10.8)

## 2019-06-10 LAB — TSH: TSH: 3.05 u[IU]/mL (ref 0.450–4.500)

## 2019-06-10 LAB — COMPREHENSIVE METABOLIC PANEL
ALT: 30 IU/L (ref 0–44)
AST: 19 IU/L (ref 0–40)
Albumin/Globulin Ratio: 1.6 (ref 1.2–2.2)
Albumin: 4.7 g/dL (ref 3.8–4.9)
Alkaline Phosphatase: 108 IU/L (ref 48–121)
BUN/Creatinine Ratio: 19 (ref 9–20)
BUN: 23 mg/dL (ref 6–24)
Bilirubin Total: 0.2 mg/dL (ref 0.0–1.2)
CO2: 26 mmol/L (ref 20–29)
Calcium: 9.7 mg/dL (ref 8.7–10.2)
Chloride: 100 mmol/L (ref 96–106)
Creatinine, Ser: 1.21 mg/dL (ref 0.76–1.27)
GFR calc Af Amer: 79 mL/min/{1.73_m2} (ref >59)
GFR calc non Af Amer: 68 mL/min/{1.73_m2} (ref >59)
Globulin, Total: 3 g/dL (ref 1.5–4.5)
Glucose: 123 mg/dL — ABNORMAL HIGH (ref 65–99)
Potassium: 4 mmol/L (ref 3.5–5.2)
Sodium: 145 mmol/L — ABNORMAL HIGH (ref 134–144)
Total Protein: 7.7 g/dL (ref 6.0–8.5)

## 2019-06-10 LAB — LIPID PANEL
Chol/HDL Ratio: 3 ratio (ref 0.0–5.0)
Cholesterol, Total: 124 mg/dL (ref 100–199)
HDL: 41 mg/dL (ref >39)
LDL Chol Calc (NIH): 66 mg/dL (ref 0–99)
Triglycerides: 89 mg/dL (ref 0–149)
VLDL Cholesterol Cal: 17 mg/dL (ref 5–40)

## 2019-07-31 DIAGNOSIS — Z20822 Contact with and (suspected) exposure to covid-19: Secondary | ICD-10-CM | POA: Diagnosis not present

## 2019-11-24 ENCOUNTER — Encounter: Payer: Self-pay | Admitting: Family Medicine

## 2019-11-24 ENCOUNTER — Ambulatory Visit: Payer: BC Managed Care – PPO | Admitting: Family Medicine

## 2019-11-24 ENCOUNTER — Other Ambulatory Visit: Payer: Self-pay

## 2019-11-24 VITALS — BP 120/84 | HR 67 | Temp 96.2°F | Wt 272.4 lb

## 2019-11-24 DIAGNOSIS — M25562 Pain in left knee: Secondary | ICD-10-CM

## 2019-11-24 DIAGNOSIS — I10 Essential (primary) hypertension: Secondary | ICD-10-CM | POA: Diagnosis not present

## 2019-11-24 DIAGNOSIS — E1169 Type 2 diabetes mellitus with other specified complication: Secondary | ICD-10-CM | POA: Diagnosis not present

## 2019-11-24 DIAGNOSIS — Z7189 Other specified counseling: Secondary | ICD-10-CM

## 2019-11-24 DIAGNOSIS — E669 Obesity, unspecified: Secondary | ICD-10-CM

## 2019-11-24 DIAGNOSIS — E118 Type 2 diabetes mellitus with unspecified complications: Secondary | ICD-10-CM | POA: Diagnosis not present

## 2019-11-24 DIAGNOSIS — E785 Hyperlipidemia, unspecified: Secondary | ICD-10-CM

## 2019-11-24 LAB — POCT GLYCOSYLATED HEMOGLOBIN (HGB A1C): Hemoglobin A1C: 6.8 % — AB (ref 4.0–5.6)

## 2019-11-24 NOTE — Progress Notes (Signed)
   Subjective:    Patient ID: Justin Green, male    DOB: 11-06-1965, 54 y.o.   MRN: [id number]  HPI He is here for a follow-up visit.  He states that he has been dieting and keeping himself busy but has been having a lot of left knee pain.  He does plan to see an orthopedic surgeon in [place] which is where he is living.  Over the last several weeks he has gained some weight because he cannot exercise the way he would like.  He continues on Lipitor, Edarbychlor, Synthroid.  He does use Cialis on an as-needed basis.   Review of Systems     Objective:   Physical Exam Alert and in no distress left knee exam shows no laxity however Mc[place]ray's testing did cause discomfort.  Hemoglobin A1c is 6.8.       Assessment & Plan:  Controlled type 2 diabetes mellitus with complication, without long-term current use of insulin (HCC)  Hyperlipidemia associated with type 2 diabetes mellitus ([place])  Essential hypertension  Counseling and coordination of care  Obesity (BMI 30-39.9)  Left knee pain, unspecified chronicity We will keep him on the present medication regimen.  Explained that at some point we will need to place him on a diabetes med.  Encouraged him to continue with his diet and exercise.  He will keep me informed concerning evaluation by the orthopedic surgeon. He then had questions concerning Covid and I did educate him concerning the risks and benefits of Covid.  He would consider getting a shot but is not ready yet.  35 minutes spent discussing all these issues with him.

## 2019-11-25 ENCOUNTER — Encounter: Payer: Self-pay | Admitting: Family Medicine

## 2019-11-26 DIAGNOSIS — Z1152 Encounter for screening for COVID-19: Secondary | ICD-10-CM | POA: Diagnosis not present

## 2019-11-26 DIAGNOSIS — Z20822 Contact with and (suspected) exposure to covid-19: Secondary | ICD-10-CM | POA: Diagnosis not present

## 2019-12-01 DIAGNOSIS — M25562 Pain in left knee: Secondary | ICD-10-CM | POA: Diagnosis not present

## 2019-12-02 DIAGNOSIS — M25562 Pain in left knee: Secondary | ICD-10-CM | POA: Diagnosis not present

## 2020-02-02 ENCOUNTER — Encounter: Payer: Self-pay | Admitting: Family Medicine

## 2020-02-03 ENCOUNTER — Other Ambulatory Visit: Payer: Self-pay | Admitting: Family Medicine

## 2020-02-03 NOTE — Telephone Encounter (Signed)
[facility] is requesting to fill pt valtrex. Please advise [facility]

## 2020-02-18 ENCOUNTER — Encounter: Payer: Self-pay | Admitting: Internal Medicine

## 2020-02-25 ENCOUNTER — Other Ambulatory Visit: Payer: Self-pay | Admitting: Family Medicine

## 2020-02-25 NOTE — Telephone Encounter (Signed)
[facility] is requesting to fill pt valacyclovir. Please advise [facility]

## 2020-03-24 ENCOUNTER — Ambulatory Visit (AMBULATORY_SURGERY_CENTER): Payer: BC Managed Care – PPO | Admitting: *Deleted

## 2020-03-24 ENCOUNTER — Other Ambulatory Visit: Payer: Self-pay

## 2020-03-24 VITALS — Ht 70.0 in | Wt 265.0 lb

## 2020-03-24 DIAGNOSIS — Z86010 Personal history of colonic polyps: Secondary | ICD-10-CM

## 2020-03-24 MED ORDER — NA SULFATE-K SULFATE-MG SULF 17.5-3.13-1.6 GM/177ML PO SOLN: ORAL | 0 refills | Status: DC

## 2020-03-24 NOTE — Progress Notes (Signed)
Patient's pre-visit was done today over the phone with the patient due to COVID-19 pandemic. Name,DOB and address verified. Insurance verified. Patient denies any allergies to Eggs and Soy. Patient denies any problems with anesthesia/sedation. Patient denies taking diet pills or blood thinners. Packet of Prep instructions mailed to patient including a copy of a consent form-pt is aware. suprep Prep coupon included. Patient understands to call us back with any questions or concerns. The patient is COVID-19 fully vaccinated, per patient. Patient is aware of our care-partner policy and NOTRR-11 safety protocol.

## 2020-03-29 LAB — HM DIABETES EYE EXAM

## 2020-04-01 ENCOUNTER — Encounter: Payer: Self-pay | Admitting: Family Medicine

## 2020-04-02 ENCOUNTER — Encounter: Payer: Self-pay | Admitting: Family Medicine

## 2020-04-06 ENCOUNTER — Encounter: Payer: Self-pay | Admitting: Internal Medicine

## 2020-04-06 DIAGNOSIS — M25562 Pain in left knee: Secondary | ICD-10-CM | POA: Diagnosis not present

## 2020-04-07 ENCOUNTER — Encounter: Payer: Self-pay | Admitting: Internal Medicine

## 2020-04-07 ENCOUNTER — Other Ambulatory Visit: Payer: Self-pay

## 2020-04-07 ENCOUNTER — Ambulatory Visit (AMBULATORY_SURGERY_CENTER): Payer: BC Managed Care – PPO | Admitting: Internal Medicine

## 2020-04-07 VITALS — BP 103/69 | HR 71 | Temp 96.9°F | Resp 13 | Ht 70.0 in | Wt 265.0 lb

## 2020-04-07 DIAGNOSIS — D120 Benign neoplasm of cecum: Secondary | ICD-10-CM

## 2020-04-07 DIAGNOSIS — Z86010 Personal history of colonic polyps: Secondary | ICD-10-CM

## 2020-04-07 DIAGNOSIS — Z1211 Encounter for screening for malignant neoplasm of colon: Secondary | ICD-10-CM | POA: Diagnosis not present

## 2020-04-07 MED ORDER — SODIUM CHLORIDE 0.9 % IV SOLN: 500.0000 mL | Freq: Once | INTRAVENOUS | Status: DC

## 2020-04-07 NOTE — Op Note (Signed)
[place] Patient Name: Justin Green Procedure Date: 04/07/2020 10:05 AM MRN: [id number] Endoscopist: Docia Chuck. Henrene Pastor , MD Age: 55 Referring MD:  Date of Birth: [birth year] Gender: Male Account #: [id number] Procedure:                Colonoscopy with cold snare polypectomy x 1 Indications:              High risk colon cancer surveillance: Personal                            history of multiple (3 or more) adenomas. Previous                            examination January 2019 Medicines:                Monitored Anesthesia Care Procedure:                Pre-Anesthesia Assessment:                           - Prior to the procedure, a History and Physical                            was performed, and patient medications and                            allergies were reviewed. The patient's tolerance of                            previous anesthesia was also reviewed. The risks                            and benefits of the procedure and the sedation                            options and risks were discussed with the patient.                            All questions were answered, and informed consent                            was obtained. Prior Anticoagulants: The patient has                            taken no previous anticoagulant or antiplatelet                            agents. After reviewing the risks and benefits, the                            patient was deemed in satisfactory condition to                            undergo the procedure.  After obtaining informed consent, the colonoscope                            was passed under direct vision. Throughout the                            procedure, the patient's blood pressure, pulse, and                            oxygen saturations were monitored continuously. The                            Olympus CF-HQ190L (Serial# [id number]) Colonoscope was                            introduced through the  anus and advanced to the the                            cecum, identified by appendiceal orifice and                            ileocecal valve. The ileocecal valve, appendiceal                            orifice, and rectum were photographed. The quality                            of the bowel preparation was excellent. The                            colonoscopy was performed without difficulty. The                            patient tolerated the procedure well. The bowel                            preparation used was SUPREP via split dose                            instruction. Scope In: 10:13:50 AM Scope Out: 10:25:14 AM Scope Withdrawal Time: 0 hours 9 minutes 1 second  Total Procedure Duration: 0 hours 11 minutes 24 seconds  Findings:                 A 2 mm polyp was found in the cecum. The polyp was                            removed with a cold snare. Resection and retrieval                            were complete.                           Multiple diverticula were found in the sigmoid  colon.                           The exam was otherwise without abnormality on                            direct and retroflexion views. Complications:            No immediate complications. Estimated blood loss:                            None. Estimated Blood Loss:     Estimated blood loss: none. Impression:               - One 2 mm polyp in the cecum, removed with a cold                            snare. Resected and retrieved.                           - Diverticulosis in the sigmoid colon.                           - The examination was otherwise normal on direct                            and retroflexion views. Recommendation:           - Repeat colonoscopy in 5 years for surveillance.                           - Patient has a contact number available for                            emergencies. The signs and symptoms of potential                             delayed complications were discussed with the                            patient. Return to normal activities tomorrow.                            Written discharge instructions were provided to the                            patient.                           - Resume previous diet.                           - Continue present medications.                           - Await pathology results. Docia Chuck. Henrene Pastor, MD 04/07/2020 10:32:30 AM This report has been signed electronically.

## 2020-04-07 NOTE — Patient Instructions (Signed)
Please read handouts provided. Continue present medications. Await pathology results. Repeat colonoscopy in 5 years.     YOU HAD AN ENDOSCOPIC PROCEDURE TODAY AT [facility]:   Refer to the procedure report that was given to you for any specific questions about what was found during the examination.  If the procedure report does not answer your questions, please call your gastroenterologist to clarify.  If you requested that your care partner not be given the details of your procedure findings, then the procedure report has been included in a sealed envelope for you to review at your convenience later.  YOU SHOULD EXPECT: Some feelings of bloating in the abdomen. Passage of more gas than usual.  Walking can help get rid of the air that was put into your GI tract during the procedure and reduce the bloating. If you had a lower endoscopy (such as a colonoscopy or flexible sigmoidoscopy) you may notice spotting of blood in your stool or on the toilet paper. If you underwent a bowel prep for your procedure, you may not have a normal bowel movement for a few days.  Please Note:  You might notice some irritation and congestion in your nose or some drainage.  This is from the oxygen used during your procedure.  There is no need for concern and it should clear up in a day or so.  SYMPTOMS TO REPORT IMMEDIATELY:   Following lower endoscopy (colonoscopy or flexible sigmoidoscopy):  Excessive amounts of blood in the stool  Significant tenderness or worsening of abdominal pains  Swelling of the abdomen that is new, acute  Fever of 100F or higher   For urgent or emergent issues, a gastroenterologist can be reached at any hour by calling [phone]. Do not use MyChart messaging for urgent concerns.    DIET:  We do recommend a small meal at first, but then you may proceed to your regular diet.  Drink plenty of fluids but you should avoid alcoholic beverages for 24  hours.  ACTIVITY:  You should plan to take it easy for the rest of today and you should NOT DRIVE or use heavy machinery until tomorrow (because of the sedation medicines used during the test).    FOLLOW UP: Our staff will call the number listed on your records 48-72 hours following your procedure to check on you and address any questions or concerns that you may have regarding the information given to you following your procedure. If we do not reach you, we will leave a message.  We will attempt to reach you two times.  During this call, we will ask if you have developed any symptoms of COVID 19. If you develop any symptoms (ie: fever, flu-like symptoms, shortness of breath, cough etc.) before then, please call [phone].  If you test positive for Covid 19 in the 2 weeks post procedure, please call and report this information to [place].    If any biopsies were taken you will be contacted by phone or by letter within the next 1-3 weeks.  Please call us at [phone] if you have not heard about the biopsies in 3 weeks.    SIGNATURES/CONFIDENTIALITY: You and/or your care partner have signed paperwork which will be entered into your electronic medical record.  These signatures attest to the fact that that the information above on your After Visit Summary has been reviewed and is understood.  Full responsibility of the confidentiality of this discharge information lies with you and/or your  care-partner.

## 2020-04-07 NOTE — Progress Notes (Signed)
pt tolerated well. VSS. awake and to recovery. Report given to RN.  

## 2020-04-07 NOTE — Progress Notes (Signed)
Vs by N Campbell,LPN in adm  Pt's states no medical or surgical changes since previsit or office visit.

## 2020-04-07 NOTE — Progress Notes (Signed)
Called to room to assist during endoscopic procedure.  Patient ID and intended procedure confirmed with present staff. Received instructions for my participation in the procedure from the performing physician.  

## 2020-04-09 ENCOUNTER — Telehealth: Payer: Self-pay

## 2020-04-09 ENCOUNTER — Telehealth: Payer: Self-pay | Admitting: *Deleted

## 2020-04-09 NOTE — Telephone Encounter (Signed)
Attempted to reach patient for post-procedure f/u call. No answer. Left message that we will make another attempt to reach him later today and for him to please not hesitate to call us if he has any questions/concerns regarding his care. 

## 2020-04-09 NOTE — Telephone Encounter (Signed)
  Follow up Call-  Call back number 04/07/2020  Post procedure Call Back phone  # [phone]  Permission to leave phone message Yes  Some recent data might be hidden     Patient questions:  Do you have a fever, pain , or abdominal swelling? No. Pain Score  0 *  Have you tolerated food without any problems? Yes.    Have you been able to return to your normal activities? Yes.    Do you have any questions about your discharge instructions: Diet   No. Medications  No. Follow up visit  No.  Do you have questions or concerns about your Care? No.  Actions: * If pain score is 4 or above: No action needed, pain <4.  1. Have you developed a fever since your procedure? no  2.   Have you had an respiratory symptoms (SOB or cough) since your procedure? no  3.   Have you tested positive for COVID 19 since your procedure no  4.   Have you had any family members/close contacts diagnosed with the COVID 19 since your procedure?  no   If yes to any of these questions please route to Joylene John, RN and Joella Prince, RN

## 2020-04-12 ENCOUNTER — Encounter: Payer: Self-pay | Admitting: Internal Medicine

## 2020-04-29 ENCOUNTER — Encounter: Payer: Self-pay | Admitting: Family Medicine

## 2020-04-29 DIAGNOSIS — N529 Male erectile dysfunction, unspecified: Secondary | ICD-10-CM

## 2020-04-29 MED ORDER — SILDENAFIL CITRATE 100 MG PO TABS: 100.0000 mg | ORAL_TABLET | ORAL | 5 refills | Status: DC | PRN

## 2020-05-19 ENCOUNTER — Other Ambulatory Visit: Payer: Self-pay | Admitting: Family Medicine

## 2020-05-19 DIAGNOSIS — E038 Other specified hypothyroidism: Secondary | ICD-10-CM

## 2020-05-19 DIAGNOSIS — I10 Essential (primary) hypertension: Secondary | ICD-10-CM

## 2020-05-24 ENCOUNTER — Encounter: Payer: BC Managed Care – PPO | Admitting: Family Medicine

## 2020-05-24 DIAGNOSIS — H02889 Meibomian gland dysfunction of unspecified eye, unspecified eyelid: Secondary | ICD-10-CM | POA: Diagnosis not present

## 2020-06-18 ENCOUNTER — Other Ambulatory Visit: Payer: Self-pay | Admitting: Family Medicine

## 2020-06-18 DIAGNOSIS — E785 Hyperlipidemia, unspecified: Secondary | ICD-10-CM

## 2020-06-18 DIAGNOSIS — E1169 Type 2 diabetes mellitus with other specified complication: Secondary | ICD-10-CM

## 2020-06-29 DIAGNOSIS — M25562 Pain in left knee: Secondary | ICD-10-CM | POA: Diagnosis not present

## 2020-06-29 DIAGNOSIS — M1712 Unilateral primary osteoarthritis, left knee: Secondary | ICD-10-CM | POA: Diagnosis not present

## 2020-07-08 DIAGNOSIS — M25562 Pain in left knee: Secondary | ICD-10-CM | POA: Diagnosis not present

## 2020-08-10 ENCOUNTER — Encounter: Payer: BC Managed Care – PPO | Admitting: Family Medicine

## 2020-08-13 DIAGNOSIS — M1712 Unilateral primary osteoarthritis, left knee: Secondary | ICD-10-CM | POA: Diagnosis not present

## 2020-08-13 DIAGNOSIS — Z01818 Encounter for other preprocedural examination: Secondary | ICD-10-CM | POA: Diagnosis not present

## 2020-09-01 ENCOUNTER — Other Ambulatory Visit: Payer: Self-pay | Admitting: Family Medicine

## 2020-09-01 DIAGNOSIS — I10 Essential (primary) hypertension: Secondary | ICD-10-CM

## 2020-09-01 DIAGNOSIS — E038 Other specified hypothyroidism: Secondary | ICD-10-CM

## 2020-09-01 NOTE — Telephone Encounter (Signed)
Left pt a Vm to call the office and schedule appt

## 2020-09-07 DIAGNOSIS — Z01818 Encounter for other preprocedural examination: Secondary | ICD-10-CM | POA: Diagnosis not present

## 2020-09-07 DIAGNOSIS — M1712 Unilateral primary osteoarthritis, left knee: Secondary | ICD-10-CM | POA: Diagnosis not present

## 2020-09-20 DIAGNOSIS — M1712 Unilateral primary osteoarthritis, left knee: Secondary | ICD-10-CM | POA: Diagnosis not present

## 2020-09-27 DIAGNOSIS — K219 Gastro-esophageal reflux disease without esophagitis: Secondary | ICD-10-CM | POA: Diagnosis not present

## 2020-09-27 DIAGNOSIS — I10 Essential (primary) hypertension: Secondary | ICD-10-CM | POA: Diagnosis not present

## 2020-09-27 DIAGNOSIS — Z01818 Encounter for other preprocedural examination: Secondary | ICD-10-CM | POA: Diagnosis not present

## 2020-09-27 DIAGNOSIS — E785 Hyperlipidemia, unspecified: Secondary | ICD-10-CM | POA: Diagnosis not present

## 2020-09-27 LAB — HEMOGLOBIN A1C: Hemoglobin A1C: 6.3

## 2020-09-28 ENCOUNTER — Encounter: Payer: Self-pay | Admitting: Family Medicine

## 2020-09-28 ENCOUNTER — Other Ambulatory Visit: Payer: Self-pay

## 2020-09-28 DIAGNOSIS — E038 Other specified hypothyroidism: Secondary | ICD-10-CM

## 2020-09-28 DIAGNOSIS — I10 Essential (primary) hypertension: Secondary | ICD-10-CM

## 2020-09-28 DIAGNOSIS — E785 Hyperlipidemia, unspecified: Secondary | ICD-10-CM

## 2020-09-28 DIAGNOSIS — E1169 Type 2 diabetes mellitus with other specified complication: Secondary | ICD-10-CM

## 2020-09-28 MED ORDER — EDARBYCLOR 40-25 MG PO TABS
1.0000 | ORAL_TABLET | Freq: Every day | ORAL | 0 refills | Status: DC
Start: 2020-09-28 — End: 2020-09-28

## 2020-09-28 MED ORDER — EDARBYCLOR 40-25 MG PO TABS: 1.0000 | ORAL_TABLET | Freq: Every day | ORAL | 0 refills | Status: DC

## 2020-09-28 MED ORDER — LEVOTHYROXINE SODIUM 150 MCG PO TABS
150.0000 ug | ORAL_TABLET | Freq: Every day | ORAL | 0 refills | Status: DC
Start: 2020-09-28 — End: 2020-09-28

## 2020-09-28 MED ORDER — ATORVASTATIN CALCIUM 20 MG PO TABS: 20.0000 mg | ORAL_TABLET | Freq: Every day | ORAL | 0 refills | Status: DC

## 2020-09-28 MED ORDER — LEVOTHYROXINE SODIUM 150 MCG PO TABS: 150.0000 ug | ORAL_TABLET | Freq: Every day | ORAL | 0 refills | Status: DC

## 2020-10-05 DIAGNOSIS — M1712 Unilateral primary osteoarthritis, left knee: Secondary | ICD-10-CM | POA: Diagnosis not present

## 2020-10-06 DIAGNOSIS — E785 Hyperlipidemia, unspecified: Secondary | ICD-10-CM | POA: Diagnosis not present

## 2020-10-06 DIAGNOSIS — I10 Essential (primary) hypertension: Secondary | ICD-10-CM | POA: Diagnosis not present

## 2020-10-06 DIAGNOSIS — E039 Hypothyroidism, unspecified: Secondary | ICD-10-CM | POA: Diagnosis not present

## 2020-10-06 DIAGNOSIS — M1712 Unilateral primary osteoarthritis, left knee: Secondary | ICD-10-CM | POA: Diagnosis not present

## 2020-10-08 DIAGNOSIS — M1712 Unilateral primary osteoarthritis, left knee: Secondary | ICD-10-CM | POA: Diagnosis not present

## 2020-10-11 DIAGNOSIS — M25562 Pain in left knee: Secondary | ICD-10-CM | POA: Diagnosis not present

## 2020-10-11 DIAGNOSIS — M1712 Unilateral primary osteoarthritis, left knee: Secondary | ICD-10-CM | POA: Diagnosis not present

## 2020-10-14 DIAGNOSIS — M25562 Pain in left knee: Secondary | ICD-10-CM | POA: Diagnosis not present

## 2020-10-14 DIAGNOSIS — M1712 Unilateral primary osteoarthritis, left knee: Secondary | ICD-10-CM | POA: Diagnosis not present

## 2020-10-19 DIAGNOSIS — M25562 Pain in left knee: Secondary | ICD-10-CM | POA: Diagnosis not present

## 2020-10-19 DIAGNOSIS — M1712 Unilateral primary osteoarthritis, left knee: Secondary | ICD-10-CM | POA: Diagnosis not present

## 2020-10-21 DIAGNOSIS — M25562 Pain in left knee: Secondary | ICD-10-CM | POA: Diagnosis not present

## 2020-10-21 DIAGNOSIS — M1712 Unilateral primary osteoarthritis, left knee: Secondary | ICD-10-CM | POA: Diagnosis not present

## 2020-10-25 DIAGNOSIS — M1712 Unilateral primary osteoarthritis, left knee: Secondary | ICD-10-CM | POA: Diagnosis not present

## 2020-10-25 DIAGNOSIS — M25562 Pain in left knee: Secondary | ICD-10-CM | POA: Diagnosis not present

## 2020-10-27 DIAGNOSIS — M1712 Unilateral primary osteoarthritis, left knee: Secondary | ICD-10-CM | POA: Diagnosis not present

## 2020-10-27 DIAGNOSIS — M25562 Pain in left knee: Secondary | ICD-10-CM | POA: Diagnosis not present

## 2020-11-01 DIAGNOSIS — M25562 Pain in left knee: Secondary | ICD-10-CM | POA: Diagnosis not present

## 2020-11-01 DIAGNOSIS — M1712 Unilateral primary osteoarthritis, left knee: Secondary | ICD-10-CM | POA: Diagnosis not present

## 2020-11-03 DIAGNOSIS — Z96652 Presence of left artificial knee joint: Secondary | ICD-10-CM | POA: Diagnosis not present

## 2020-11-03 DIAGNOSIS — Z471 Aftercare following joint replacement surgery: Secondary | ICD-10-CM | POA: Diagnosis not present

## 2020-11-04 DIAGNOSIS — M25562 Pain in left knee: Secondary | ICD-10-CM | POA: Diagnosis not present

## 2020-11-04 DIAGNOSIS — M1712 Unilateral primary osteoarthritis, left knee: Secondary | ICD-10-CM | POA: Diagnosis not present

## 2020-11-10 DIAGNOSIS — M1712 Unilateral primary osteoarthritis, left knee: Secondary | ICD-10-CM | POA: Diagnosis not present

## 2020-11-10 DIAGNOSIS — M25562 Pain in left knee: Secondary | ICD-10-CM | POA: Diagnosis not present

## 2020-11-12 DIAGNOSIS — M25562 Pain in left knee: Secondary | ICD-10-CM | POA: Diagnosis not present

## 2020-11-12 DIAGNOSIS — M1712 Unilateral primary osteoarthritis, left knee: Secondary | ICD-10-CM | POA: Diagnosis not present

## 2020-11-15 DIAGNOSIS — M1712 Unilateral primary osteoarthritis, left knee: Secondary | ICD-10-CM | POA: Diagnosis not present

## 2020-11-15 DIAGNOSIS — M25562 Pain in left knee: Secondary | ICD-10-CM | POA: Diagnosis not present

## 2020-11-16 ENCOUNTER — Ambulatory Visit: Payer: BC Managed Care – PPO | Admitting: Family Medicine

## 2020-11-16 ENCOUNTER — Encounter: Payer: Self-pay | Admitting: Family Medicine

## 2020-11-16 ENCOUNTER — Other Ambulatory Visit: Payer: Self-pay

## 2020-11-16 VITALS — BP 112/72 | HR 77 | Temp 96.5°F | Wt 258.2 lb

## 2020-11-16 DIAGNOSIS — E118 Type 2 diabetes mellitus with unspecified complications: Secondary | ICD-10-CM | POA: Diagnosis not present

## 2020-11-16 DIAGNOSIS — Z9889 Other specified postprocedural states: Secondary | ICD-10-CM

## 2020-11-16 DIAGNOSIS — E038 Other specified hypothyroidism: Secondary | ICD-10-CM | POA: Diagnosis not present

## 2020-11-16 DIAGNOSIS — Z1159 Encounter for screening for other viral diseases: Secondary | ICD-10-CM

## 2020-11-16 DIAGNOSIS — I10 Essential (primary) hypertension: Secondary | ICD-10-CM

## 2020-11-16 NOTE — Progress Notes (Signed)
   Subjective:    Patient ID: Justin Green, male    DOB: 1965/08/09, 55 y.o.   MRN: [id number]  HPI He is here for an interval evaluation.  He has had recent partial knee replacement on the left.  He seems to be doing quite well doing rehab on this.  Prior to the surgery he worked on getting his BMI down below 40 and he is now at 37.05.  His clothes are fitting him much looser.  He has a goal of getting down to around 220.  He continues on Lipitor he is having no difficulty with that.  He is also on Edarbyclor.  He continues on his thyroid medication.   Review of Systems     Objective:   Physical Exam .  Blood work from the previous surgery was reviewed. Hemoglobin A1c with his last surgery was 6.3      Assessment & Plan:  Controlled type 2 diabetes mellitus with complication, without long-term current use of insulin ([place])  Need for hepatitis C screening test - Plan: Hepatitis C antibody  Primary hypertension  Other specified hypothyroidism  History of knee surgery I congratulated him on the work  to get his weight down.  Encouraged him to continue to lose weight.  Recommend he has had a waist size rather than a weight.  Explained that since his A1c is down lower at some point we can potentially relabel him as diabetes in remission.  Discussed the knee rehab program with him and strongly encouraged him to get full extension as well as working on his quads.  Recheck here in about 4 to 6 months.

## 2020-11-17 ENCOUNTER — Encounter: Payer: Self-pay | Admitting: Family Medicine

## 2020-11-17 DIAGNOSIS — M25562 Pain in left knee: Secondary | ICD-10-CM | POA: Diagnosis not present

## 2020-11-17 DIAGNOSIS — M1712 Unilateral primary osteoarthritis, left knee: Secondary | ICD-10-CM | POA: Diagnosis not present

## 2020-11-17 LAB — HEPATITIS C ANTIBODY: Hep C Virus Ab: 0.2 s/co ratio (ref 0.0–0.9)

## 2020-11-22 DIAGNOSIS — M25562 Pain in left knee: Secondary | ICD-10-CM | POA: Diagnosis not present

## 2020-11-22 DIAGNOSIS — M1712 Unilateral primary osteoarthritis, left knee: Secondary | ICD-10-CM | POA: Diagnosis not present

## 2020-11-29 ENCOUNTER — Other Ambulatory Visit: Payer: Self-pay | Admitting: Family Medicine

## 2020-11-29 DIAGNOSIS — E038 Other specified hypothyroidism: Secondary | ICD-10-CM

## 2020-11-29 DIAGNOSIS — E785 Hyperlipidemia, unspecified: Secondary | ICD-10-CM

## 2020-11-29 DIAGNOSIS — I10 Essential (primary) hypertension: Secondary | ICD-10-CM

## 2020-11-30 DIAGNOSIS — M1712 Unilateral primary osteoarthritis, left knee: Secondary | ICD-10-CM | POA: Diagnosis not present

## 2020-11-30 DIAGNOSIS — M25562 Pain in left knee: Secondary | ICD-10-CM | POA: Diagnosis not present

## 2020-12-03 DIAGNOSIS — M25562 Pain in left knee: Secondary | ICD-10-CM | POA: Diagnosis not present

## 2020-12-03 DIAGNOSIS — M1712 Unilateral primary osteoarthritis, left knee: Secondary | ICD-10-CM | POA: Diagnosis not present

## 2021-03-21 ENCOUNTER — Other Ambulatory Visit: Payer: Self-pay | Admitting: Family Medicine

## 2021-03-21 DIAGNOSIS — E038 Other specified hypothyroidism: Secondary | ICD-10-CM

## 2021-03-21 DIAGNOSIS — E1169 Type 2 diabetes mellitus with other specified complication: Secondary | ICD-10-CM

## 2021-03-21 DIAGNOSIS — I10 Essential (primary) hypertension: Secondary | ICD-10-CM

## 2021-04-18 DIAGNOSIS — M1712 Unilateral primary osteoarthritis, left knee: Secondary | ICD-10-CM | POA: Diagnosis not present

## 2021-04-18 DIAGNOSIS — M25512 Pain in left shoulder: Secondary | ICD-10-CM | POA: Diagnosis not present

## 2021-04-18 DIAGNOSIS — M19012 Primary osteoarthritis, left shoulder: Secondary | ICD-10-CM | POA: Diagnosis not present

## 2021-05-19 ENCOUNTER — Encounter: Payer: BC Managed Care – PPO | Admitting: Family Medicine

## 2021-06-27 ENCOUNTER — Other Ambulatory Visit: Payer: Self-pay | Admitting: Family Medicine

## 2021-06-27 DIAGNOSIS — E1169 Type 2 diabetes mellitus with other specified complication: Secondary | ICD-10-CM

## 2021-06-27 DIAGNOSIS — I10 Essential (primary) hypertension: Secondary | ICD-10-CM

## 2021-06-27 DIAGNOSIS — E038 Other specified hypothyroidism: Secondary | ICD-10-CM

## 2021-06-27 NOTE — Telephone Encounter (Signed)
Sent my chart message to advise of the need for med check appt . KH

## 2021-06-29 ENCOUNTER — Other Ambulatory Visit: Payer: Self-pay | Admitting: Family Medicine

## 2021-06-29 DIAGNOSIS — E038 Other specified hypothyroidism: Secondary | ICD-10-CM

## 2021-06-29 DIAGNOSIS — I10 Essential (primary) hypertension: Secondary | ICD-10-CM

## 2021-06-29 DIAGNOSIS — E1169 Type 2 diabetes mellitus with other specified complication: Secondary | ICD-10-CM

## 2021-06-29 NOTE — Telephone Encounter (Signed)
Pt has been called yesterday and sent a mychart earlier in the week to schedule has he is due for an appt. I will deny again

## 2021-07-11 ENCOUNTER — Other Ambulatory Visit: Payer: Self-pay

## 2021-07-11 DIAGNOSIS — E038 Other specified hypothyroidism: Secondary | ICD-10-CM

## 2021-07-11 DIAGNOSIS — I10 Essential (primary) hypertension: Secondary | ICD-10-CM

## 2021-07-11 DIAGNOSIS — E1169 Type 2 diabetes mellitus with other specified complication: Secondary | ICD-10-CM

## 2021-07-11 MED ORDER — EDARBYCLOR 40-25 MG PO TABS: 1.0000 | ORAL_TABLET | Freq: Every day | ORAL | 0 refills | Status: DC

## 2021-07-11 MED ORDER — LEVOTHYROXINE SODIUM 150 MCG PO TABS: 150.0000 ug | ORAL_TABLET | Freq: Every day | ORAL | 0 refills | Status: DC

## 2021-07-11 MED ORDER — ATORVASTATIN CALCIUM 20 MG PO TABS: 20.0000 mg | ORAL_TABLET | Freq: Every day | ORAL | 0 refills | Status: DC

## 2021-08-09 ENCOUNTER — Ambulatory Visit (INDEPENDENT_AMBULATORY_CARE_PROVIDER_SITE_OTHER): Payer: BC Managed Care – PPO | Admitting: Family Medicine

## 2021-08-09 ENCOUNTER — Other Ambulatory Visit: Payer: Self-pay | Admitting: Family Medicine

## 2021-08-09 VITALS — BP 100/66 | HR 80 | Temp 96.8°F | Wt 273.8 lb

## 2021-08-09 DIAGNOSIS — E038 Other specified hypothyroidism: Secondary | ICD-10-CM

## 2021-08-09 DIAGNOSIS — N529 Male erectile dysfunction, unspecified: Secondary | ICD-10-CM

## 2021-08-09 DIAGNOSIS — E1169 Type 2 diabetes mellitus with other specified complication: Secondary | ICD-10-CM

## 2021-08-09 DIAGNOSIS — I10 Essential (primary) hypertension: Secondary | ICD-10-CM

## 2021-08-09 DIAGNOSIS — E785 Hyperlipidemia, unspecified: Secondary | ICD-10-CM

## 2021-08-09 MED ORDER — SILDENAFIL CITRATE 100 MG PO TABS: 100.0000 mg | ORAL_TABLET | ORAL | 5 refills | Status: DC | PRN

## 2021-08-09 MED ORDER — LEVOTHYROXINE SODIUM 150 MCG PO TABS: 150.0000 ug | ORAL_TABLET | Freq: Every day | ORAL | 0 refills | Status: DC

## 2021-08-09 MED ORDER — ATORVASTATIN CALCIUM 20 MG PO TABS: 20.0000 mg | ORAL_TABLET | Freq: Every day | ORAL | 0 refills | Status: DC

## 2021-08-09 MED ORDER — EDARBYCLOR 40-25 MG PO TABS: 1.0000 | ORAL_TABLET | Freq: Every day | ORAL | 0 refills | Status: DC

## 2021-08-09 NOTE — Progress Notes (Signed)
   Subjective:    Patient ID: Justin Green, male    DOB: [birth year], 56 y.o.   MRN: [id number]  HPI He is here for an interval evaluation.  He continues on Edarbychlor and is having no difficulty with that.  Atorvastatin causes no aches or pains.  He continues on Synthroid without any difficulties.  He had a left TKR in October and has done quite nicely.  He would also like a refill on his ED medication.  He would like refills and is scheduled for complete exam in the future.   Review of Systems     Objective:   Physical Exam Alert and in no distress otherwise not examined       Assessment & Plan:  Essential hypertension - Plan: Azilsartan-Chlorthalidone (EDARBYCLOR) 40-25 MG TABS  Erectile dysfunction, unspecified erectile dysfunction type - Plan: sildenafil (VIAGRA) 100 MG tablet  Hyperlipidemia associated with type 2 diabetes mellitus ([place]) - Plan: atorvastatin (LIPITOR) 20 MG tablet  Other specified hypothyroidism - Plan: levothyroxine (SYNTHROID) 150 MCG tablet

## 2021-08-23 ENCOUNTER — Encounter: Payer: Self-pay | Admitting: Family Medicine

## 2021-08-23 NOTE — Telephone Encounter (Signed)
Pt called and got scheduled for a virtual to discuss sleep testing.

## 2021-08-26 ENCOUNTER — Encounter: Payer: Self-pay | Admitting: Family Medicine

## 2021-08-26 ENCOUNTER — Telehealth: Payer: BC Managed Care – PPO | Admitting: Family Medicine

## 2021-08-26 VITALS — BP 100/66 | Ht 69.0 in | Wt 272.0 lb

## 2021-08-26 DIAGNOSIS — G4733 Obstructive sleep apnea (adult) (pediatric): Secondary | ICD-10-CM

## 2021-08-26 NOTE — Progress Notes (Signed)
   Subjective:    Patient ID: Justin Green, male    DOB: 12-Jul-1965, 56 y.o.   MRN: [id number]  HPI Documentation for virtual audio and video telecommunications through [place] encounter:  The patient was located at home. 2 patient identifiers used.  The provider was located in the office. The patient did consent to this visit and is aware of possible charges through their insurance for this visit. The other persons participating in this telemedicine service were none. Time spent on call was 5 minutes and in review of previous records >15 minutes total for counseling and coordination of care. This virtual service is not related to other E/M service within previous 7 days.  Today's visit is to discuss sleep apnea.  Apparently he was diagnosed with sleep apnea several years ago but not at a high enough level to require CPAP.  His girlfriend has noted on several occasions that he stops breathing and snores quite heavily.  He would like to be retested for this.   Review of Systems     Objective:   Physical Exam Alert and in no distress.  Epworth scale score of 6.       Assessment & Plan:  OSA (obstructive sleep apnea) I will order [facility] since he is in [place].

## 2021-08-31 ENCOUNTER — Telehealth: Payer: Self-pay | Admitting: Family Medicine

## 2021-08-31 ENCOUNTER — Other Ambulatory Visit: Payer: Self-pay

## 2021-08-31 DIAGNOSIS — E038 Other specified hypothyroidism: Secondary | ICD-10-CM

## 2021-08-31 DIAGNOSIS — I10 Essential (primary) hypertension: Secondary | ICD-10-CM

## 2021-08-31 DIAGNOSIS — E785 Hyperlipidemia, unspecified: Secondary | ICD-10-CM

## 2021-08-31 MED ORDER — EDARBYCLOR 40-25 MG PO TABS: 1.0000 | ORAL_TABLET | Freq: Every day | ORAL | 0 refills | Status: DC

## 2021-08-31 MED ORDER — LEVOTHYROXINE SODIUM 150 MCG PO TABS: 150.0000 ug | ORAL_TABLET | Freq: Every day | ORAL | 0 refills | Status: DC

## 2021-08-31 MED ORDER — ATORVASTATIN CALCIUM 20 MG PO TABS: 20.0000 mg | ORAL_TABLET | Freq: Every day | ORAL | 0 refills | Status: DC

## 2021-08-31 NOTE — Telephone Encounter (Signed)
Pt came in for an appt and got refills but for only 30 days. Pt is leaving to go out of the country on Saturday and needs refills of Lipitor, Edarbyclor and synthroid. Please send to [facility]. He states he normally gets refills for several months and he's not sure why he only got 30 days. Pt can be reached at [phone].

## 2021-08-31 NOTE — Telephone Encounter (Signed)
Done KH 

## 2021-09-07 ENCOUNTER — Encounter: Payer: Self-pay | Admitting: Internal Medicine

## 2021-09-19 ENCOUNTER — Other Ambulatory Visit: Payer: BC Managed Care – PPO

## 2021-09-19 DIAGNOSIS — E1169 Type 2 diabetes mellitus with other specified complication: Secondary | ICD-10-CM

## 2021-09-19 DIAGNOSIS — E785 Hyperlipidemia, unspecified: Secondary | ICD-10-CM | POA: Diagnosis not present

## 2021-09-20 LAB — LIPID PANEL
Chol/HDL Ratio: 3 ratio (ref 0.0–5.0)
Cholesterol, Total: 143 mg/dL (ref 100–199)
HDL: 48 mg/dL (ref >39)
LDL Chol Calc (NIH): 76 mg/dL (ref 0–99)
Triglycerides: 101 mg/dL (ref 0–149)
VLDL Cholesterol Cal: 19 mg/dL (ref 5–40)

## 2021-10-05 ENCOUNTER — Other Ambulatory Visit: Payer: Self-pay

## 2021-10-05 ENCOUNTER — Telehealth: Payer: Self-pay | Admitting: Family Medicine

## 2021-10-05 DIAGNOSIS — E038 Other specified hypothyroidism: Secondary | ICD-10-CM

## 2021-10-05 DIAGNOSIS — I10 Essential (primary) hypertension: Secondary | ICD-10-CM

## 2021-10-05 DIAGNOSIS — E1169 Type 2 diabetes mellitus with other specified complication: Secondary | ICD-10-CM

## 2021-10-05 MED ORDER — ATORVASTATIN CALCIUM 20 MG PO TABS: 20.0000 mg | ORAL_TABLET | Freq: Every day | ORAL | 0 refills | Status: DC

## 2021-10-05 MED ORDER — LEVOTHYROXINE SODIUM 150 MCG PO TABS: 150.0000 ug | ORAL_TABLET | Freq: Every day | ORAL | 0 refills | Status: DC

## 2021-10-05 MED ORDER — EDARBYCLOR 40-25 MG PO TABS: 1.0000 | ORAL_TABLET | Freq: Every day | ORAL | 0 refills | Status: DC

## 2021-10-05 NOTE — Telephone Encounter (Signed)
Pt called and says he only has a week worth of medication left and would like a refill on levothyroxine, atorvastatin and edarbyclor to [place], [place]

## 2021-10-05 NOTE — Telephone Encounter (Signed)
Done kh

## 2021-10-10 ENCOUNTER — Telehealth: Payer: BC Managed Care – PPO | Admitting: Family Medicine

## 2021-10-10 DIAGNOSIS — G4733 Obstructive sleep apnea (adult) (pediatric): Secondary | ICD-10-CM | POA: Diagnosis not present

## 2021-10-10 NOTE — Progress Notes (Signed)
   Subjective:    Patient ID: Justin Green, male    DOB: [birth year], 56 y.o.   MRN: [id number]  HPI Documentation for virtual audio and video telecommunications through [place] encounter: The patient was located at home. 2 patient identifiers used.  The provider was located in the office. The patient did consent to this visit and is aware of possible charges through their insurance for this visit. The other persons participating in this telemedicine service were none. Time spent on call was 5 minutes and in review of previous records >20 minutes total for counseling and coordination of care. This virtual service is not related to other E/M service within previous 7 days.  He was recently tested for sleep apnea and shown to have severe sleep apnea and significant desaturation.     Review of Systems     Objective:   Physical Exam Alert and in no distress otherwise not examined       Assessment & Plan:  OSA (obstructive sleep apnea) I discussed the results of this with him and the need for CPAP .He lives in [place] and we will therefore have to get a company in that area.  Discussed sleep hygiene with him in terms of cutting back on alcohol, decongestants, caffeine and possible need for a sleep medication.  Once he gets the machine he is to then let me know so we can get a readout after about 1 month to ensure benefit.  He expressed understanding of this.

## 2021-10-18 ENCOUNTER — Encounter: Payer: Self-pay | Admitting: Family Medicine

## 2021-10-19 ENCOUNTER — Other Ambulatory Visit: Payer: Self-pay

## 2021-10-19 DIAGNOSIS — I10 Essential (primary) hypertension: Secondary | ICD-10-CM

## 2021-10-19 DIAGNOSIS — E038 Other specified hypothyroidism: Secondary | ICD-10-CM

## 2021-10-19 DIAGNOSIS — E1169 Type 2 diabetes mellitus with other specified complication: Secondary | ICD-10-CM

## 2021-10-19 MED ORDER — ATORVASTATIN CALCIUM 20 MG PO TABS: 20.0000 mg | ORAL_TABLET | Freq: Every day | ORAL | 5 refills | Status: DC

## 2021-10-19 MED ORDER — LEVOTHYROXINE SODIUM 150 MCG PO TABS: 150.0000 ug | ORAL_TABLET | Freq: Every day | ORAL | 5 refills | Status: DC

## 2021-10-19 MED ORDER — EDARBYCLOR 40-25 MG PO TABS: 1.0000 | ORAL_TABLET | Freq: Every day | ORAL | 5 refills | Status: DC

## 2021-10-24 ENCOUNTER — Encounter: Payer: Self-pay | Admitting: Internal Medicine

## 2021-10-27 ENCOUNTER — Telehealth: Payer: Self-pay

## 2021-10-27 NOTE — Patient Outreach (Signed)
  Care Coordination   10/27/2021 Name: Justin Green MRN: [id number] DOB: 1965/10/03   Care Coordination Outreach Attempts:  An unsuccessful telephone outreach was attempted today to offer the patient information about available care coordination services as a benefit of their health plan.   Follow Up Plan:  Additional outreach attempts will be made to offer the patient care coordination information and services.   Encounter Outcome:  No Answer  Care Coordination Interventions Activated:  No   Care Coordination Interventions:  No, not indicated    Enzo Montgomery, RN,BSN,CCM [facility] Telephonic Care Management Coordinator Direct Phone: [phone] Toll Free: [phone] Fax: [phone]

## 2021-11-01 ENCOUNTER — Telehealth: Payer: Self-pay

## 2021-11-01 NOTE — Patient Outreach (Signed)
  Care Coordination   11/01/2021 Name: Justin Green MRN: [id number] DOB: 06/09/1965   Care Coordination Outreach Attempts:  A second unsuccessful outreach was attempted today to offer the patient with information about available care coordination services as a benefit of their health plan.     Follow Up Plan:  Additional outreach attempts will be made to offer the patient care coordination information and services.   Encounter Outcome:  No Answer  Care Coordination Interventions Activated:  No   Care Coordination Interventions:  No, not indicated    Enzo Montgomery, RN,BSN,CCM [facility] Telephonic Care Management Coordinator Direct Phone: [phone] Toll Free: [phone] Fax: [phone]

## 2021-11-04 DIAGNOSIS — G4733 Obstructive sleep apnea (adult) (pediatric): Secondary | ICD-10-CM | POA: Diagnosis not present

## 2021-12-04 DIAGNOSIS — G4733 Obstructive sleep apnea (adult) (pediatric): Secondary | ICD-10-CM | POA: Diagnosis not present

## 2021-12-21 ENCOUNTER — Encounter: Payer: Self-pay | Admitting: Internal Medicine

## 2022-01-04 DIAGNOSIS — G4733 Obstructive sleep apnea (adult) (pediatric): Secondary | ICD-10-CM | POA: Diagnosis not present

## 2022-02-04 DIAGNOSIS — G4733 Obstructive sleep apnea (adult) (pediatric): Secondary | ICD-10-CM | POA: Diagnosis not present

## 2022-02-13 ENCOUNTER — Encounter: Payer: BC Managed Care – PPO | Admitting: Family Medicine

## 2022-02-15 DIAGNOSIS — E1169 Type 2 diabetes mellitus with other specified complication: Secondary | ICD-10-CM | POA: Insufficient documentation

## 2022-02-16 ENCOUNTER — Encounter: Payer: Self-pay | Admitting: Family Medicine

## 2022-02-16 ENCOUNTER — Ambulatory Visit: Payer: BC Managed Care – PPO | Admitting: Family Medicine

## 2022-02-16 ENCOUNTER — Other Ambulatory Visit: Payer: Self-pay

## 2022-02-16 VITALS — BP 90/62 | HR 72 | Temp 97.8°F | Ht 69.25 in | Wt 259.4 lb

## 2022-02-16 DIAGNOSIS — Z23 Encounter for immunization: Secondary | ICD-10-CM | POA: Diagnosis not present

## 2022-02-16 DIAGNOSIS — I152 Hypertension secondary to endocrine disorders: Secondary | ICD-10-CM

## 2022-02-16 DIAGNOSIS — E785 Hyperlipidemia, unspecified: Secondary | ICD-10-CM

## 2022-02-16 DIAGNOSIS — K219 Gastro-esophageal reflux disease without esophagitis: Secondary | ICD-10-CM | POA: Diagnosis not present

## 2022-02-16 DIAGNOSIS — E1159 Type 2 diabetes mellitus with other circulatory complications: Secondary | ICD-10-CM

## 2022-02-16 DIAGNOSIS — E118 Type 2 diabetes mellitus with unspecified complications: Secondary | ICD-10-CM | POA: Diagnosis not present

## 2022-02-16 DIAGNOSIS — E1169 Type 2 diabetes mellitus with other specified complication: Secondary | ICD-10-CM | POA: Diagnosis not present

## 2022-02-16 DIAGNOSIS — Z8619 Personal history of other infectious and parasitic diseases: Secondary | ICD-10-CM

## 2022-02-16 DIAGNOSIS — N529 Male erectile dysfunction, unspecified: Secondary | ICD-10-CM

## 2022-02-16 DIAGNOSIS — Z0000 Encounter for general adult medical examination without abnormal findings: Secondary | ICD-10-CM

## 2022-02-16 DIAGNOSIS — E038 Other specified hypothyroidism: Secondary | ICD-10-CM

## 2022-02-16 DIAGNOSIS — G4730 Sleep apnea, unspecified: Secondary | ICD-10-CM | POA: Diagnosis not present

## 2022-02-16 DIAGNOSIS — Z86010 Personal history of colonic polyps: Secondary | ICD-10-CM | POA: Diagnosis not present

## 2022-02-16 DIAGNOSIS — I10 Essential (primary) hypertension: Secondary | ICD-10-CM

## 2022-02-16 LAB — POCT GLYCOSYLATED HEMOGLOBIN (HGB A1C): Hemoglobin A1C: 6.3 % — AB (ref 4.0–5.6)

## 2022-02-16 LAB — POCT UA - MICROALBUMIN
Albumin/Creatinine Ratio, Urine, POC: 3.2
Creatinine, POC: 358.2 mg/dL
Microalbumin Ur, POC: 11.4 mg/L

## 2022-02-16 LAB — LIPID PANEL

## 2022-02-16 MED ORDER — ATORVASTATIN CALCIUM 20 MG PO TABS: 20.0000 mg | ORAL_TABLET | Freq: Every day | ORAL | 11 refills | Status: DC

## 2022-02-16 MED ORDER — LEVOTHYROXINE SODIUM 150 MCG PO TABS: 150.0000 ug | ORAL_TABLET | Freq: Every day | ORAL | 5 refills | Status: DC

## 2022-02-16 MED ORDER — EDARBYCLOR 40-25 MG PO TABS: 1.0000 | ORAL_TABLET | Freq: Every day | ORAL | 5 refills | Status: DC

## 2022-02-16 NOTE — Progress Notes (Signed)
Complete physical exam  Patient: Justin Green   DOB: 09/23/1965   57 y.o. Male  MRN: [id number]  Subjective:    Chief Complaint  Patient presents with   Annual Exam    Fasting. No additional concerns.     Makaih Tatge is a 57 y.o. male who presents today for a complete physical exam. He reports consuming a general diet. Gym/ health club routine includes cardio. He generally feels fairly well. He reports sleeping well. He does have OSA and the CPAP machine is working quite nicely for him.  He has noted increased energy and stamina.  It is also helped him lose some weight.  He is lost roughly 12 pounds.  He is also noted an improvement in his blood pressure.  Does have a history of colonic polyps and is scheduled for repeat in 2027.  He continues on Edarbyclor and having no difficulty with that.  Continues on Synthroid.  He does use sildenafil on an as-needed basis.  He has no problems taking Lipitor.  His work is going well.  Otherwise his family and social history as well as health maintenance and immunizations reviewed.  Most recent fall risk assessment:    02/16/2022    1:56 PM  [place] in the past year? 1  Number falls in past yr: 0  Injury with Fall? 0  Risk for fall due to : No Fall Risks  Follow up Falls evaluation completed     Most recent depression screenings:    02/16/2022    1:56 PM 08/26/2021    2:38 PM  PHQ 2/9 Scores  PHQ - 2 Score 0 0    Vision:Within last year and Dental: Receives regular dental care    Patient Care Team: Denita Lung, MD as PCP - General (Family Medicine)   Outpatient Medications Prior to Visit  Medication Sig   Ascorbic Acid (VITAMIN C PO) Take by mouth.   Cholecalciferol (VITAMIN D3 PO) Take by mouth.   Cyanocobalamin (VITAMIN B-12 PO) Take by mouth.   Multiple Vitamins-Minerals (ZINC PO) Take by mouth.   Omega-3 Fatty Acids (FISH OIL PO) Take by mouth.   sildenafil (VIAGRA) 100 MG tablet Take 1 tablet (100  mg total) by mouth as needed for erectile dysfunction.   valACYclovir (VALTREX) 1000 MG tablet TAKE ONE TABLET BY MOUTH TWICE A DAY   [DISCONTINUED] atorvastatin (LIPITOR) 20 MG tablet Take 1 tablet (20 mg total) by mouth daily.   [DISCONTINUED] Azilsartan-Chlorthalidone (EDARBYCLOR) 40-25 MG TABS Take 1 tablet by mouth daily.   [DISCONTINUED] levothyroxine (SYNTHROID) 150 MCG tablet Take 1 tablet (150 mcg total) by mouth daily.   No facility-administered medications prior to visit.    Review of Systems  All other systems reviewed and are negative.         Objective:     BP 90/62   Pulse 72   Temp 97.8 F (36.6 C) (Oral)   Ht 5' 9.25" (1.759 m)   Wt 259 lb 6.4 oz (117.7 kg)   SpO2 96% Comment: room air  BMI 38.03 kg/m    Physical Exam  Alert and in no distress. Tympanic membranes and canals are normal. Pharyngeal area is normal. Neck is supple without adenopathy or thyromegaly. Cardiac exam shows a regular sinus rhythm without murmurs or gallops. Lungs are clear to auscultation. Diabetic foot exam normal. Hemoglobin A1c 6.3 Results for orders placed or performed in visit on 02/16/22  POCT glycosylated hemoglobin (Hb  A1C)  Result Value Ref Range   Hemoglobin A1C 6.3 (A) 4.0 - 5.6 %  POCT UA - Microalbumin  Result Value Ref Range   Microalbumin Ur, POC 11.4 mg/L   Creatinine, POC 358.2 mg/dL   Albumin/Creatinine Ratio, Urine, POC 3.2        Assessment & Plan:     Immunization History  Administered Date(s) Administered   Influenza Whole 08/25/2010   Influenza,inj,Quad PF,6+ Mos 08/28/2014, 11/16/2015, 10/24/2016, 11/27/2017, 01/16/2019   Influenza-Unspecified 08/28/2014, 11/16/2015, 10/24/2016, 11/27/2017, 01/16/2019   Moderna Sars-Covid-2 Vaccination 12/29/2019, 01/29/2020   Pneumococcal Conjugate-13 01/16/2019   Tdap 05/22/2011, 02/16/2022   Unspecified SARS-COV-2 Vaccination 01/29/2020    Health Maintenance  Topic Date Due   HIV Screening  Never done    Diabetic kidney evaluation - eGFR measurement  06/08/2020   OPHTHALMOLOGY EXAM  03/29/2021   FOOT EXAM  11/16/2021   INFLUENZA VACCINE  04/02/2022 (Originally 08/02/2021)   Zoster Vaccines- Shingrix (1 of 2) 07/15/2022 (Originally 06/13/2015)   HEMOGLOBIN A1C  08/17/2022   Diabetic kidney evaluation - Urine ACR  02/17/2023   COLONOSCOPY (Pts 45-67yr Insurance coverage will need to be confirmed)  04/08/2023   DTaP/Tdap/Td (3 - Td or Tdap) 02/17/2032   Hepatitis C Screening  Completed   HPV VACCINES  Aged Out   COVID-19 Vaccine  Discontinued    Discussed health benefits of physical activity, and encouraged him to engage in regular exercise appropriate for his age and condition.  Continue on present medication regimen.  Reinforced continued use of CPAP however I do not think that is going to be an issue as he is definitely noted an improvement.  Problem List Items Addressed This Visit     Controlled type 2 diabetes mellitus with complication, without long-term current use of insulin (HCC)   Relevant Medications   atorvastatin (LIPITOR) 20 MG tablet   Azilsartan-Chlorthalidone (EDARBYCLOR) 40-25 MG TABS   Other Relevant Orders   POCT glycosylated hemoglobin (Hb A1C) (Completed)   POCT UA - Microalbumin (Completed)   Comprehensive metabolic panel   Lipid panel   CBC with Differential/Platelet   GERD (gastroesophageal reflux disease)   Hx of colonic polyps   Hyperlipidemia associated with type 2 diabetes mellitus (HCC)   Relevant Medications   atorvastatin (LIPITOR) 20 MG tablet   Azilsartan-Chlorthalidone (EDARBYCLOR) 40-25 MG TABS   Other Relevant Orders   Lipid panel   Hypothyroid   Relevant Medications   levothyroxine (SYNTHROID) 150 MCG tablet   Other Relevant Orders   TSH   Sleep apnea   Other Visit Diagnoses     Routine general medical examination at a health care facility    -  Primary   Relevant Orders   Comprehensive metabolic panel   Lipid panel   CBC with  Differential/Platelet   Hypertension associated with diabetes (HCC)       Relevant Medications   atorvastatin (LIPITOR) 20 MG tablet   Azilsartan-Chlorthalidone (EDARBYCLOR) 40-25 MG TABS   Other Relevant Orders   Comprehensive metabolic panel   CBC with Differential/Platelet   Erectile dysfunction, unspecified erectile dysfunction type       Need for Tdap vaccination       Relevant Orders   Tdap vaccine greater than or equal to 7yo IM (Completed)      Recheck 6 months     JJill Alexanders MD

## 2022-02-17 ENCOUNTER — Encounter: Payer: Self-pay | Admitting: Family Medicine

## 2022-02-17 LAB — TSH: TSH: 5.73 u[IU]/mL — ABNORMAL HIGH (ref 0.450–4.500)

## 2022-02-17 LAB — CBC WITH DIFFERENTIAL/PLATELET
Basophils Absolute: 0.1 10*3/uL (ref 0.0–0.2)
Basos: 1 %
EOS (ABSOLUTE): 0.2 10*3/uL (ref 0.0–0.4)
Eos: 2 %
Hematocrit: 43.9 % (ref 37.5–51.0)
Hemoglobin: 14.4 g/dL (ref 13.0–17.7)
Immature Grans (Abs): 0 10*3/uL (ref 0.0–0.1)
Immature Granulocytes: 0 %
Lymphocytes Absolute: 1.8 10*3/uL (ref 0.7–3.1)
Lymphs: 18 %
MCH: 29.1 pg (ref 26.6–33.0)
MCHC: 32.8 g/dL (ref 31.5–35.7)
MCV: 89 fL (ref 79–97)
Monocytes Absolute: 0.7 10*3/uL (ref 0.1–0.9)
Monocytes: 7 %
Neutrophils Absolute: 7.4 10*3/uL — ABNORMAL HIGH (ref 1.4–7.0)
Neutrophils: 72 %
Platelets: 214 10*3/uL (ref 150–450)
RBC: 4.95 x10E6/uL (ref 4.14–5.80)
RDW: 12.3 % (ref 11.6–15.4)
WBC: 10.1 10*3/uL (ref 3.4–10.8)

## 2022-02-17 LAB — COMPREHENSIVE METABOLIC PANEL
ALT: 20 IU/L (ref 0–44)
AST: 13 IU/L (ref 0–40)
Albumin/Globulin Ratio: 1.9 (ref 1.2–2.2)
Albumin: 4.5 g/dL (ref 3.8–4.9)
Alkaline Phosphatase: 82 IU/L (ref 44–121)
BUN/Creatinine Ratio: 24 — ABNORMAL HIGH (ref 9–20)
BUN: 27 mg/dL — ABNORMAL HIGH (ref 6–24)
Bilirubin Total: 0.4 mg/dL (ref 0.0–1.2)
CO2: 23 mmol/L (ref 20–29)
Calcium: 10 mg/dL (ref 8.7–10.2)
Chloride: 101 mmol/L (ref 96–106)
Creatinine, Ser: 1.11 mg/dL (ref 0.76–1.27)
Globulin, Total: 2.4 g/dL (ref 1.5–4.5)
Glucose: 92 mg/dL (ref 70–99)
Potassium: 3.6 mmol/L (ref 3.5–5.2)
Sodium: 140 mmol/L (ref 134–144)
Total Protein: 6.9 g/dL (ref 6.0–8.5)
eGFR: 78 mL/min/{1.73_m2} (ref >59)

## 2022-02-17 LAB — LIPID PANEL
Chol/HDL Ratio: 2.5 ratio (ref 0.0–5.0)
Cholesterol, Total: 104 mg/dL (ref 100–199)
HDL: 41 mg/dL (ref >39)
LDL Chol Calc (NIH): 45 mg/dL (ref 0–99)
Triglycerides: 91 mg/dL (ref 0–149)
VLDL Cholesterol Cal: 18 mg/dL (ref 5–40)

## 2022-02-23 ENCOUNTER — Encounter: Payer: BC Managed Care – PPO | Admitting: Family Medicine

## 2022-03-20 DIAGNOSIS — M5459 Other low back pain: Secondary | ICD-10-CM | POA: Diagnosis not present

## 2022-04-05 DIAGNOSIS — G4733 Obstructive sleep apnea (adult) (pediatric): Secondary | ICD-10-CM | POA: Diagnosis not present

## 2022-04-10 DIAGNOSIS — M5459 Other low back pain: Secondary | ICD-10-CM | POA: Diagnosis not present

## 2022-04-10 DIAGNOSIS — M6281 Muscle weakness (generalized): Secondary | ICD-10-CM | POA: Diagnosis not present

## 2022-04-20 DIAGNOSIS — M6281 Muscle weakness (generalized): Secondary | ICD-10-CM | POA: Diagnosis not present

## 2022-04-20 DIAGNOSIS — M5459 Other low back pain: Secondary | ICD-10-CM | POA: Diagnosis not present

## 2022-04-25 DIAGNOSIS — M5459 Other low back pain: Secondary | ICD-10-CM | POA: Diagnosis not present

## 2022-04-25 DIAGNOSIS — M6281 Muscle weakness (generalized): Secondary | ICD-10-CM | POA: Diagnosis not present

## 2022-05-01 DIAGNOSIS — M6281 Muscle weakness (generalized): Secondary | ICD-10-CM | POA: Diagnosis not present

## 2022-05-01 DIAGNOSIS — M5459 Other low back pain: Secondary | ICD-10-CM | POA: Diagnosis not present

## 2022-05-11 DIAGNOSIS — M5459 Other low back pain: Secondary | ICD-10-CM | POA: Diagnosis not present

## 2022-05-11 DIAGNOSIS — M6281 Muscle weakness (generalized): Secondary | ICD-10-CM | POA: Diagnosis not present

## 2022-06-23 DIAGNOSIS — B880 Other acariasis: Secondary | ICD-10-CM | POA: Diagnosis not present

## 2022-06-23 DIAGNOSIS — H01009 Unspecified blepharitis unspecified eye, unspecified eyelid: Secondary | ICD-10-CM | POA: Diagnosis not present

## 2022-08-29 DIAGNOSIS — G4733 Obstructive sleep apnea (adult) (pediatric): Secondary | ICD-10-CM | POA: Diagnosis not present

## 2022-09-12 ENCOUNTER — Other Ambulatory Visit: Payer: Self-pay | Admitting: Family Medicine

## 2022-11-11 ENCOUNTER — Other Ambulatory Visit: Payer: Self-pay | Admitting: Family Medicine

## 2022-11-11 DIAGNOSIS — E038 Other specified hypothyroidism: Secondary | ICD-10-CM

## 2022-11-24 ENCOUNTER — Other Ambulatory Visit: Payer: Self-pay | Admitting: Family Medicine

## 2022-11-24 DIAGNOSIS — N529 Male erectile dysfunction, unspecified: Secondary | ICD-10-CM

## 2022-11-24 NOTE — Telephone Encounter (Signed)
Is this okay to refill>? 

## 2022-12-29 DIAGNOSIS — G4733 Obstructive sleep apnea (adult) (pediatric): Secondary | ICD-10-CM | POA: Diagnosis not present

## 2023-01-09 ENCOUNTER — Telehealth: Payer: Self-pay | Admitting: Family Medicine

## 2023-01-09 ENCOUNTER — Other Ambulatory Visit: Payer: Self-pay

## 2023-01-09 DIAGNOSIS — I152 Hypertension secondary to endocrine disorders: Secondary | ICD-10-CM

## 2023-01-09 MED ORDER — EDARBYCLOR 40-25 MG PO TABS: 1.0000 | ORAL_TABLET | Freq: Every day | ORAL | 1 refills | Status: DC

## 2023-01-09 NOTE — Telephone Encounter (Signed)
 Fx from [facility] 40-25 mg tabs # 30 Last filled 12/25/22

## 2023-02-06 ENCOUNTER — Other Ambulatory Visit: Payer: Self-pay | Admitting: Family Medicine

## 2023-02-06 DIAGNOSIS — E1169 Type 2 diabetes mellitus with other specified complication: Secondary | ICD-10-CM

## 2023-02-13 LAB — HM DIABETES EYE EXAM

## 2023-02-22 ENCOUNTER — Encounter: Payer: BC Managed Care – PPO | Admitting: Family Medicine

## 2023-02-27 ENCOUNTER — Encounter: Payer: Self-pay | Admitting: Internal Medicine

## 2023-03-05 ENCOUNTER — Other Ambulatory Visit: Payer: Self-pay | Admitting: Family Medicine

## 2023-03-05 DIAGNOSIS — E785 Hyperlipidemia, unspecified: Secondary | ICD-10-CM

## 2023-04-04 ENCOUNTER — Other Ambulatory Visit: Payer: Self-pay | Admitting: Family Medicine

## 2023-04-04 DIAGNOSIS — I152 Hypertension secondary to endocrine disorders: Secondary | ICD-10-CM

## 2023-04-04 NOTE — Telephone Encounter (Signed)
 Copied from CRM [id number]. Topic: Clinical - Medication Refill >> Apr 04, 2023 10:26 AM Emylou G wrote: Most Recent Primary Care Visit:  Provider: Ronnald Nian  Department: [facility]  Visit Type: PHYSICAL 45  Date: 02/16/2022  Medication: Azilsartan-Chlorthalidone (EDARBYCLOR) 40-25 MG TABS  Has the patient contacted their pharmacy? Yes (Agent: If no, request that the patient contact the pharmacy for the refill. If patient does not wish to contact the pharmacy document the reason why and proceed with request.) (Agent: If yes, when and what did the pharmacy advise?) said we never ordered  Is this the correct pharmacy for this prescription? Yes If no, delete pharmacy and type the correct one.  This is the patient's preferred pharmacy:  [facility] [place], [place] - [address] [place] [address] Phone: [phone] Fax: [phone]  Has the prescription been filled recently? Yes - last month  Is the patient out of the medication? Yes  Has the patient been seen for an appointment in the last year OR does the patient have an upcoming appointment? Yes  Can we respond through MyChart? Yes  Agent: Please be advised that Rx refills may take up to 3 business days. We ask that you follow-up with your pharmacy.

## 2023-04-04 NOTE — Telephone Encounter (Signed)
 Last apt 02/16/22 has future apt. In May.

## 2023-04-05 MED ORDER — EDARBYCLOR 40-25 MG PO TABS: 1.0000 | ORAL_TABLET | Freq: Every day | ORAL | 1 refills | Status: DC

## 2023-04-27 ENCOUNTER — Other Ambulatory Visit: Payer: Self-pay | Admitting: Family Medicine

## 2023-04-27 DIAGNOSIS — E038 Other specified hypothyroidism: Secondary | ICD-10-CM

## 2023-05-04 DIAGNOSIS — G4733 Obstructive sleep apnea (adult) (pediatric): Secondary | ICD-10-CM | POA: Diagnosis not present

## 2023-05-30 DIAGNOSIS — N5201 Erectile dysfunction due to arterial insufficiency: Secondary | ICD-10-CM | POA: Insufficient documentation

## 2023-05-31 ENCOUNTER — Ambulatory Visit: Payer: BC Managed Care – PPO | Admitting: Family Medicine

## 2023-05-31 VITALS — BP 110/78 | HR 82 | Ht 70.0 in | Wt 229.4 lb

## 2023-05-31 DIAGNOSIS — G4730 Sleep apnea, unspecified: Secondary | ICD-10-CM

## 2023-05-31 DIAGNOSIS — Z0000 Encounter for general adult medical examination without abnormal findings: Secondary | ICD-10-CM | POA: Diagnosis not present

## 2023-05-31 DIAGNOSIS — E038 Other specified hypothyroidism: Secondary | ICD-10-CM | POA: Diagnosis not present

## 2023-05-31 DIAGNOSIS — E1169 Type 2 diabetes mellitus with other specified complication: Secondary | ICD-10-CM | POA: Diagnosis not present

## 2023-05-31 DIAGNOSIS — Z860100 Personal history of colon polyps, unspecified: Secondary | ICD-10-CM | POA: Diagnosis not present

## 2023-05-31 DIAGNOSIS — K219 Gastro-esophageal reflux disease without esophagitis: Secondary | ICD-10-CM

## 2023-05-31 DIAGNOSIS — E785 Hyperlipidemia, unspecified: Secondary | ICD-10-CM

## 2023-05-31 DIAGNOSIS — Z125 Encounter for screening for malignant neoplasm of prostate: Secondary | ICD-10-CM

## 2023-05-31 DIAGNOSIS — Z23 Encounter for immunization: Secondary | ICD-10-CM

## 2023-05-31 DIAGNOSIS — N529 Male erectile dysfunction, unspecified: Secondary | ICD-10-CM

## 2023-05-31 DIAGNOSIS — I10 Essential (primary) hypertension: Secondary | ICD-10-CM

## 2023-05-31 DIAGNOSIS — E1159 Type 2 diabetes mellitus with other circulatory complications: Secondary | ICD-10-CM

## 2023-05-31 DIAGNOSIS — N5201 Erectile dysfunction due to arterial insufficiency: Secondary | ICD-10-CM

## 2023-05-31 DIAGNOSIS — Z8619 Personal history of other infectious and parasitic diseases: Secondary | ICD-10-CM

## 2023-05-31 DIAGNOSIS — I152 Hypertension secondary to endocrine disorders: Secondary | ICD-10-CM

## 2023-05-31 DIAGNOSIS — E118 Type 2 diabetes mellitus with unspecified complications: Secondary | ICD-10-CM | POA: Diagnosis not present

## 2023-05-31 LAB — POCT UA - MICROALBUMIN
Albumin/Creatinine Ratio, Urine, POC: 8.3
Creatinine, POC: 171.5 mg/dL
Microalbumin Ur, POC: 4.8 mg/L

## 2023-05-31 LAB — POCT GLYCOSYLATED HEMOGLOBIN (HGB A1C): Hemoglobin A1C: 5.9 % — AB (ref 4.0–5.6)

## 2023-05-31 MED ORDER — LEVOTHYROXINE SODIUM 150 MCG PO TABS: 150.0000 ug | ORAL_TABLET | Freq: Every day | ORAL | 3 refills | Status: AC

## 2023-05-31 MED ORDER — EDARBYCLOR 40-25 MG PO TABS: 1.0000 | ORAL_TABLET | Freq: Every day | ORAL | 11 refills | Status: AC

## 2023-05-31 MED ORDER — ATORVASTATIN CALCIUM 20 MG PO TABS: 20.0000 mg | ORAL_TABLET | Freq: Every day | ORAL | 3 refills | Status: AC

## 2023-05-31 MED ORDER — TIRZEPATIDE 2.5 MG/0.5ML ~~LOC~~ SOAJ
2.5000 mg | SUBCUTANEOUS | 1 refills | Status: DC
Start: 2023-05-31 — End: 2023-07-02

## 2023-05-31 MED ORDER — SILDENAFIL CITRATE 100 MG PO TABS: 100.0000 mg | ORAL_TABLET | ORAL | 5 refills | Status: AC | PRN

## 2023-05-31 MED ORDER — VALACYCLOVIR HCL 1 G PO TABS: 1000.0000 mg | ORAL_TABLET | Freq: Two times a day (BID) | ORAL | 3 refills | Status: AC

## 2023-05-31 NOTE — Progress Notes (Signed)
 Complete physical exam  Patient: Justin Green   DOB: August 26, 1965   58 y.o. Male  MRN: [id number]  Subjective:     Chief Complaint  Patient presents with   Annual Exam    Cpe. Fasting.     Bianca Vester is a 58 y.o. male who presents today for a complete physical exam.  He reports consuming a general diet. Gym/ health club routine includes stationary bike and walking. He generally feels well. He reports sleeping well.  He does have a history of diabetes dating back to 2021 with a hemoglobin A1c of 6.8.  He has been doing a good job with diet and exercise.  His weight is down to 229.  Recently he did use Wegovy at 2.4 mg but apparently his insurance would not cover this.  I am unclear why he was given [facility] since he truly did have diabetes.  This was done in an office in [place].  He continues on Edarbychlor as well as Synthroid .  He does use Viagra  on an as-needed basis.  He would like his Valtrex  renewed.  Continues on Lipitor without difficulty.  He does have OSA and is using his CPAP and finds this quite useful.  He does have a history of colonic polyps and is scheduled for routine 5-year follow-up.  Reflux is causing no difficulty.  He is in a stable relationship and his work is going well.  Most recent fall risk assessment:    05/31/2023    1:53 PM  Fall Risk   Falls in the past year? 0  Number falls in past yr: 0  Injury with Fall? 0  Risk for fall due to : No Fall Risks  Follow up Falls evaluation completed     Most recent depression screenings:    05/31/2023    1:54 PM 02/16/2022    1:56 PM  PHQ 2/9 Scores  PHQ - 2 Score 0 0    Vision:Within last year and diabetic eye exam due and Dental: No current dental problems and Last dental visit: within the last year.    Immunization History  Administered Date(s) Administered   Influenza Whole 08/25/2010   Influenza,inj,Quad PF,6+ Mos 08/28/2014, 11/16/2015, 10/24/2016, 11/27/2017, 01/16/2019    Influenza-Unspecified 08/28/2014, 11/16/2015, 10/24/2016, 11/27/2017, 01/16/2019   Moderna Sars-Covid-2 Vaccination 12/29/2019, 01/29/2020   Pneumococcal Conjugate-13 01/16/2019   Tdap 05/22/2011, 02/16/2022   Unspecified SARS-COV-2 Vaccination 01/29/2020    Health Maintenance  Topic Date Due   HIV Screening  Never done   Zoster Vaccines- Shingrix (1 of 2) Never done   Pneumococcal Vaccine 65-13 Years old (2 of 2 - PPSV23) 03/13/2019   Diabetic kidney evaluation - eGFR measurement  02/17/2023   FOOT EXAM  02/17/2023   Colonoscopy  04/08/2023   INFLUENZA VACCINE  08/03/2023   HEMOGLOBIN A1C  12/01/2023   OPHTHALMOLOGY EXAM  02/13/2024   Diabetic kidney evaluation - Urine ACR  05/30/2024   DTaP/Tdap/Td (3 - Td or Tdap) 02/17/2032   Hepatitis C Screening  Completed   HPV VACCINES  Aged Out   Meningococcal B Vaccine  Aged Out   COVID-19 Vaccine  Discontinued    Patient Care Team: Watson Hacking, MD as PCP - General (Family Medicine)   Outpatient Medications Prior to Visit  Medication Sig   Ascorbic Acid (VITAMIN C PO) Take by mouth.   Cholecalciferol (VITAMIN D3 PO) Take by mouth.   Cyanocobalamin (VITAMIN B-12 PO) Take by mouth.   Multiple Vitamins-Minerals (ZINC PO) Take  by mouth.   Omega-3 Fatty Acids (FISH OIL PO) Take by mouth.   [DISCONTINUED] atorvastatin  (LIPITOR) 20 MG tablet TAKE 1 Tablet BY MOUTH ONCE DAILY   [DISCONTINUED] Azilsartan-Chlorthalidone  (EDARBYCLOR ) 40-25 MG TABS Take 1 tablet by mouth daily.   [DISCONTINUED] levothyroxine  (SYNTHROID ) 150 MCG tablet TAKE 1 Tablet BY MOUTH ONCE DAILY   [DISCONTINUED] sildenafil  (VIAGRA ) 100 MG tablet TAKE 1 TABLET BY MOUTH AS NEEDED FOR ERECTILE DYSFUNCTION   [DISCONTINUED] valACYclovir  (VALTREX ) 1000 MG tablet TAKE ONE TABLET BY MOUTH TWICE A DAY   No facility-administered medications prior to visit.    ROS  Family and social history as well as health maintenance and immunizations was reviewed.     Objective:     BP 110/78   Pulse 82   Ht 5\' 10"  (1.778 m)   Wt 229 lb 6.4 oz (104.1 kg)   PF 98 L/min   BMI 32.92 kg/m    Physical Exam  Alert and in no distress. Tympanic membranes and canals are normal. Pharyngeal area is normal. Neck is supple without adenopathy or thyromegaly. Cardiac exam shows a regular sinus rhythm without murmurs or gallops. Lungs are clear to auscultation. Hemoglobin A1c 5.9     Assessment & Plan:    Discussed health benefits of physical activity, and encouraged him to engage in regular exercise appropriate for his age and condition.  Continue on present medication regimen.  Routine general medical examination at a health care facility  Sleep apnea, unspecified type  Other specified hypothyroidism - Plan: TSH, levothyroxine  (SYNTHROID ) 150 MCG tablet  Hyperlipidemia associated with type 2 diabetes mellitus (HCC) - Plan: Lipid panel, atorvastatin  (LIPITOR) 20 MG tablet  Hx of colonic polyps  Controlled type 2 diabetes mellitus with complication, without long-term current use of insulin (HCC) - Plan: CBC with Differential/Platelet, Comprehensive metabolic panel with GFR, Lipid panel, POCT glycosylated hemoglobin (Hb A1C), POCT UA - Microalbumin, tirzepatide (MOUNJARO) 2.5 MG/0.5ML Pen  Gastroesophageal reflux disease without esophagitis  Screening for prostate cancer - Plan: PSA  Erectile dysfunction due to arterial insufficiency  Hypertension associated with diabetes (HCC) - Plan: Azilsartan-Chlorthalidone  (EDARBYCLOR ) 40-25 MG TABS  Erectile dysfunction, unspecified erectile dysfunction type - Plan: sildenafil  (VIAGRA ) 100 MG tablet  History of herpes labialis  Immunizations were offered however he would like to hold off Return in about 1 year (around 05/30/2024).      Ron Cobbs, MD

## 2023-06-01 ENCOUNTER — Ambulatory Visit: Payer: Self-pay | Admitting: Family Medicine

## 2023-06-01 LAB — COMPREHENSIVE METABOLIC PANEL WITH GFR
ALT: 19 IU/L (ref 0–44)
AST: 14 IU/L (ref 0–40)
Albumin: 4.7 g/dL (ref 3.8–4.9)
Alkaline Phosphatase: 84 IU/L (ref 44–121)
BUN/Creatinine Ratio: 25 — ABNORMAL HIGH (ref 9–20)
BUN: 26 mg/dL — ABNORMAL HIGH (ref 6–24)
Bilirubin Total: 0.3 mg/dL (ref 0.0–1.2)
CO2: 23 mmol/L (ref 20–29)
Calcium: 9.3 mg/dL (ref 8.7–10.2)
Chloride: 100 mmol/L (ref 96–106)
Creatinine, Ser: 1.06 mg/dL (ref 0.76–1.27)
Globulin, Total: 2.1 g/dL (ref 1.5–4.5)
Glucose: 94 mg/dL (ref 70–99)
Potassium: 3.9 mmol/L (ref 3.5–5.2)
Sodium: 140 mmol/L (ref 134–144)
Total Protein: 6.8 g/dL (ref 6.0–8.5)
eGFR: 82 mL/min/{1.73_m2} (ref >59)

## 2023-06-01 LAB — LIPID PANEL
Chol/HDL Ratio: 2.2 ratio (ref 0.0–5.0)
Cholesterol, Total: 110 mg/dL (ref 100–199)
HDL: 49 mg/dL (ref >39)
LDL Chol Calc (NIH): 48 mg/dL (ref 0–99)
Triglycerides: 58 mg/dL (ref 0–149)
VLDL Cholesterol Cal: 13 mg/dL (ref 5–40)

## 2023-06-01 LAB — CBC WITH DIFFERENTIAL/PLATELET
Basophils Absolute: 0.1 10*3/uL (ref 0.0–0.2)
Basos: 1 %
EOS (ABSOLUTE): 0.1 10*3/uL (ref 0.0–0.4)
Eos: 1 %
Hematocrit: 44 % (ref 37.5–51.0)
Hemoglobin: 14.9 g/dL (ref 13.0–17.7)
Immature Grans (Abs): 0 10*3/uL (ref 0.0–0.1)
Immature Granulocytes: 0 %
Lymphocytes Absolute: 1.9 10*3/uL (ref 0.7–3.1)
Lymphs: 22 %
MCH: 30.8 pg (ref 26.6–33.0)
MCHC: 33.9 g/dL (ref 31.5–35.7)
MCV: 91 fL (ref 79–97)
Monocytes Absolute: 0.6 10*3/uL (ref 0.1–0.9)
Monocytes: 8 %
Neutrophils Absolute: 5.7 10*3/uL (ref 1.4–7.0)
Neutrophils: 68 %
Platelets: 187 10*3/uL (ref 150–450)
RBC: 4.83 x10E6/uL (ref 4.14–5.80)
RDW: 12.4 % (ref 11.6–15.4)
WBC: 8.4 10*3/uL (ref 3.4–10.8)

## 2023-06-01 LAB — TSH: TSH: 0.764 u[IU]/mL (ref 0.450–4.500)

## 2023-06-01 LAB — PSA: Prostate Specific Ag, Serum: 1 ng/mL (ref 0.0–4.0)

## 2023-06-08 ENCOUNTER — Telehealth: Payer: Self-pay

## 2023-06-08 ENCOUNTER — Other Ambulatory Visit (HOSPITAL_COMMUNITY): Payer: Self-pay

## 2023-06-08 NOTE — Telephone Encounter (Signed)
 Pharmacy Patient Advocate Encounter  Received notification from [employer] that Prior Authorization for Mounjaro  2.5MG /0.5ML auto-injectors has been APPROVED to 6.5.26. Ran test claim, Copay is $20.00. This test claim was processed through [facility]- copay amounts may vary at other pharmacies due to pharmacy/plan contracts, or as the patient moves through the different stages of their insurance plan.   PA #/Case ID/Reference #: (Key: [id number])    OFFICE HAS BEEN NOTIFIED VIA RX REQ MSGS

## 2023-07-02 MED ORDER — TIRZEPATIDE 5 MG/0.5ML ~~LOC~~ SOAJ
5.0000 mg | SUBCUTANEOUS | 1 refills | Status: AC
Start: 2023-07-02 — End: ?

## 2023-07-04 ENCOUNTER — Telehealth: Payer: Self-pay | Admitting: Family Medicine

## 2023-07-04 NOTE — Telephone Encounter (Signed)
 P.A. completed & approved for the 5mg  Mounjaro  called [facility] [address] T# [phone] & had them reverse the 2.5mg  & then reprocess the 5mg  it went thru & they are getting it ready for pt to pick up.  Called pt & left message explaining that every time his dosage is increased his insurance is going in require a P.A.   Joyce Norleen BROCKS, MD to Me      07/03/23  1:13 PM Leita please help with this Lattie Carlo BROCKS, CMA to Joyce Norleen BROCKS, MD     07/03/23  8:53 AM Please advise Elsie Betters Deloria Betters to [facility] (supporting Norleen BROCKS Joyce, MD) [facility]    07/02/23  7:44 PM I spoke to [facility] twice today. Basically, they explained to me that I was granted prior authorization for Mounjaro  2.5 for a year. In order to up the dosage, I have to get another prior authorization for the 5.0. Each time I go up a dosage, I will need to get another prior authorization. The pharmacist explained that most times, the best plan is to find out the end game dosage and map out the increased dosages in monthly increments. So take the 5.0mg  for a couple of months and then graduate to the 7.5mg  for a couple of months and then the 10mg .    But the insurance people were upfront and said that the only Mounjaro  that I am approved for currently is the 2.5mg .    If someone would please call me and let me know what the plan of action is, I would really appreciate it. I am very frustrated. POCT glycosylated hemoglobin (Hb A1C); POCT UA - Microalbumin; CBC with Differential/Platelet; Comprehensive metabolic panel with GFR; Lipid panel (2 more orders) Elsie Betters Deloria Betters to [facility] [employer] (supporting Norleen BROCKS Joyce, MD) [facility]    07/02/23  5:56 PM I am furious. I am leaving town and will be gone for three weeks. And I will be without once again. Why is this so difficult?

## 2023-07-05 ENCOUNTER — Telehealth: Payer: Self-pay

## 2023-07-05 NOTE — Telephone Encounter (Signed)
 FYI-Faxed form has been received stating Mounjaro  5MG /0.5ML  has been Approved from 5.2.25 to 7.1.26. I will attach to pts media.

## 2023-07-18 ENCOUNTER — Other Ambulatory Visit (HOSPITAL_COMMUNITY): Payer: Self-pay

## 2023-08-13 DIAGNOSIS — G4733 Obstructive sleep apnea (adult) (pediatric): Secondary | ICD-10-CM | POA: Diagnosis not present

## 2023-08-14 DIAGNOSIS — G4733 Obstructive sleep apnea (adult) (pediatric): Secondary | ICD-10-CM | POA: Diagnosis not present

## 2023-09-10 ENCOUNTER — Encounter: Payer: Self-pay | Admitting: Family Medicine

## 2023-09-10 NOTE — Telephone Encounter (Signed)
 He's scheduled for 09/11

## 2023-09-12 ENCOUNTER — Ambulatory Visit: Admitting: Family Medicine

## 2023-09-13 ENCOUNTER — Ambulatory Visit: Admitting: Family Medicine

## 2023-09-13 ENCOUNTER — Encounter: Payer: Self-pay | Admitting: Family Medicine

## 2023-09-13 VITALS — BP 128/82 | HR 66 | Wt 242.8 lb

## 2023-09-13 DIAGNOSIS — E118 Type 2 diabetes mellitus with unspecified complications: Secondary | ICD-10-CM | POA: Diagnosis not present

## 2023-09-13 MED ORDER — TIRZEPATIDE 7.5 MG/0.5ML ~~LOC~~ SOAJ: 7.5000 mg | SUBCUTANEOUS | 2 refills | Status: DC

## 2023-09-13 NOTE — Progress Notes (Signed)
   Name: Justin Green   Date of Visit: 09/13/23   Date of last visit with me: Visit date not found   CHIEF COMPLAINT:  Chief Complaint  Patient presents with   Medication Management    Med check.       HPI:  Discussed the use of AI scribe software for clinical note transcription with the patient, who gave verbal consent to proceed.  History of Present Illness   Justin Green is a 58 year old male with prediabetes who presents with weight gain and medication management concerns.  He has a history of using Wegovy for weight management, starting at a dose of 1.7 mg and increasing to 2.4 mg over approximately eight to nine months. During this period, he successfully reduced his weight from 280 lbs to 229 lbs. However, due to insurance issues, he had to discontinue Wegovy, leading to a weight increase to 242 lbs as of his last visit.  He started on a low dose of 2.5 mg of Mounjaro  for one month, which was then increased to 5 mg for the past two months. Despite this, he experiences persistent hunger and 'food noise,' which he did not experience while on Wegovy. He notes that others have informed him that higher doses are necessary to suppress hunger effectively.  He recently returned from a vacation in the [place], during which he gained additional weight, increasing from 235 lbs to 242 lbs. He attributes this gain to dietary indulgences and water retention from the trip.  He currently resides in [place] and travels for his medical appointments. 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         OBJECTIVE:       05/31/2023    1:54 PM  Depression screen PHQ 2/9  Decreased Interest 0  Down, Depressed, Hopeless 0  PHQ - 2 Score 0     BP Readings from Last 3 Encounters:  09/13/23 128/82  05/31/23 110/78  02/16/22 90/62    BP 128/82   Pulse 66   Wt 242 lb 12.8 oz (110.1 kg)    SpO2 99%   BMI 34.84 kg/m    Physical Exam   MEASUREMENTS: Weight- 242.      Physical Exam Constitutional:      Appearance: Normal appearance.  Neurological:     General: No focal deficit present.     Mental Status: He is alert and oriented to person, place, and time. Mental status is at baseline.     ASSESSMENT/PLAN:   Assessment & Plan Controlled type 2 diabetes mellitus with complication, without long-term current use of insulin (HCC)    Assessment and Plan    Obesity Weight increased from 229 lbs to 242 lbs after stopping Wegovy. Mounjaro  5 mg not effectively controlling appetite. Pre diabetes previously with concern for worsening diabetes which will be uncontrolled at this rate.  - Increase Mounjaro  to 7.5 mg for three weeks given signifincat weight gain of 20 lbs over last 3 months.  - Report condition via MyChart after three weeks. - If improved, increase Mounjaro  to 10 mg. - Send prescription for 7.5 mg with 30-day supply and two refills.  Type 2 diabetes mellitus  - Mounjaro  appropriate for glucose control and weight management.         Brelynn Wheller A. Vita MD [facility]

## 2023-11-12 DIAGNOSIS — M1711 Unilateral primary osteoarthritis, right knee: Secondary | ICD-10-CM | POA: Diagnosis not present

## 2023-11-27 DIAGNOSIS — E785 Hyperlipidemia, unspecified: Secondary | ICD-10-CM | POA: Diagnosis not present

## 2023-11-27 DIAGNOSIS — Z01818 Encounter for other preprocedural examination: Secondary | ICD-10-CM | POA: Diagnosis not present

## 2023-11-27 DIAGNOSIS — R7303 Prediabetes: Secondary | ICD-10-CM | POA: Diagnosis not present

## 2023-11-27 DIAGNOSIS — I10 Essential (primary) hypertension: Secondary | ICD-10-CM | POA: Diagnosis not present

## 2023-12-03 DIAGNOSIS — G4733 Obstructive sleep apnea (adult) (pediatric): Secondary | ICD-10-CM | POA: Diagnosis not present

## 2023-12-07 DIAGNOSIS — M1711 Unilateral primary osteoarthritis, right knee: Secondary | ICD-10-CM | POA: Diagnosis not present

## 2023-12-21 DIAGNOSIS — M1711 Unilateral primary osteoarthritis, right knee: Secondary | ICD-10-CM | POA: Diagnosis not present

## 2023-12-27 ENCOUNTER — Other Ambulatory Visit: Payer: Self-pay | Admitting: Family Medicine

## 2023-12-27 DIAGNOSIS — E118 Type 2 diabetes mellitus with unspecified complications: Secondary | ICD-10-CM
